# Patient Record
Sex: Male | Born: 1990 | Race: Black or African American | Hispanic: No | Marital: Single | Smoking: Never smoker
Health system: Southern US, Community
[De-identification: ages and names within clinical notes are randomized; demographics above are authoritative.]

---
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
## 2010-05-12 ENCOUNTER — Emergency Department (HOSPITAL_COMMUNITY): Admission: EM | Admit: 2010-05-12 | Discharge: 2010-05-12 | Payer: Self-pay | Admitting: Family Medicine

## 2010-11-15 ENCOUNTER — Emergency Department (HOSPITAL_COMMUNITY)
Admission: EM | Admit: 2010-11-15 | Discharge: 2010-11-15 | Payer: Self-pay | Source: Home / Self Care | Admitting: Family Medicine

## 2011-02-06 LAB — CBC
HCT: 42.3 % (ref 39.0–52.0)
Hemoglobin: 14.3 g/dL (ref 13.0–17.0)
RDW: 13.3 % (ref 11.5–15.5)
WBC: 15.5 10*3/uL — ABNORMAL HIGH (ref 4.0–10.5)

## 2011-02-06 LAB — POCT I-STAT, CHEM 8
BUN: 9 mg/dL (ref 6–23)
Calcium, Ion: 1.12 mmol/L (ref 1.12–1.32)
Creatinine, Ser: 1.1 mg/dL (ref 0.4–1.5)
TCO2: 26 mmol/L (ref 0–100)

## 2011-02-06 LAB — DIFFERENTIAL
Eosinophils Relative: 2 % (ref 0–5)
Lymphocytes Relative: 18 % (ref 12–46)
Lymphs Abs: 2.8 10*3/uL (ref 0.7–4.0)
Monocytes Absolute: 1 10*3/uL (ref 0.1–1.0)

## 2011-02-06 LAB — CULTURE, ROUTINE-ABSCESS

## 2011-04-01 ENCOUNTER — Inpatient Hospital Stay (INDEPENDENT_AMBULATORY_CARE_PROVIDER_SITE_OTHER)
Admission: RE | Admit: 2011-04-01 | Discharge: 2011-04-01 | Disposition: A | Discharge status: Home / Self Care | Payer: Self-pay | Source: Ambulatory Visit | Attending: Family Medicine | Admitting: Family Medicine

## 2011-04-01 DIAGNOSIS — K047 Periapical abscess without sinus: Secondary | ICD-10-CM

## 2013-02-14 ENCOUNTER — Encounter (HOSPITAL_COMMUNITY): Payer: Self-pay | Admitting: Emergency Medicine

## 2013-02-14 ENCOUNTER — Emergency Department (INDEPENDENT_AMBULATORY_CARE_PROVIDER_SITE_OTHER)
Admission: EM | Admit: 2013-02-14 | Discharge: 2013-02-14 | Disposition: A | Discharge status: Home / Self Care | Payer: Self-pay | Source: Home / Self Care | Attending: Family Medicine | Admitting: Family Medicine

## 2013-02-14 DIAGNOSIS — K121 Other forms of stomatitis: Secondary | ICD-10-CM

## 2013-02-14 MED ORDER — VALACYCLOVIR HCL 1 G PO TABS: 1000.0000 mg | ORAL_TABLET | Freq: Two times a day (BID) | ORAL | Status: AC

## 2013-02-14 MED ORDER — CIMETIDINE HCL 300 MG/5ML PO SOLN: 300.0000 mg | Freq: Two times a day (BID) | ORAL | Status: DC

## 2013-02-14 MED ORDER — IBUPROFEN 600 MG PO TABS: 600.0000 mg | ORAL_TABLET | Freq: Three times a day (TID) | ORAL | Status: DC | PRN

## 2013-02-14 MED ORDER — BENZOCAINE 20 % MT PSTE: 1.0000 "application " | PASTE | Freq: Four times a day (QID) | OROMUCOSAL | Status: DC | PRN

## 2013-02-14 NOTE — ED Notes (Signed)
Pt is here for sore on mouth onset Sunday Sx include: pain Denies: f/v/n/d Seen here about a yr ago at the [facility] for the same reason Does not remember dx Taking ibup w/no relief.   He is alert and oriented w/no signs of acute distress.

## 2013-02-14 NOTE — ED Provider Notes (Signed)
History     CSN: [id number]  Arrival date & time 02/14/13  4098   First MD Initiated Contact with Patient 02/14/13 1848      Chief Complaint  Patient presents with  . Mouth Lesions    (Consider location/radiation/quality/duration/timing/severity/associated sxs/prior treatment) HPI Comments: 22 year old smoker male here complaining of sores in his lips and his gums for 3 days. He has had some nasal congestion, general malaise associated with chills and subjective fever. He's taking over-the-counter 200 mg Advil with minimal improvement. Denies sore throat, headache, abdominal pain nausea vomiting or diarrhea. No rash in other body areas. No difficulty breathing or swallowing. No recent new medications or known triggers. Patient states she had similar symptoms about one year ago.    History reviewed. No pertinent past medical history.  History reviewed. No pertinent past surgical history.  No family history on file.  History  Substance Use Topics  . Smoking status: Current Every Day Smoker  . Smokeless tobacco: Not on file  . Alcohol Use: Yes      Review of Systems  Constitutional: Positive for fever and chills. Negative for diaphoresis and appetite change.  HENT: Positive for congestion and mouth sores. Negative for sore throat, trouble swallowing and voice change.   Eyes: Negative for pain and redness.  Respiratory: Negative for cough and shortness of breath.   Gastrointestinal: Negative for nausea, vomiting, abdominal pain and diarrhea.  Genitourinary: Negative for dysuria.  Skin: Negative for rash.  Neurological: Negative for dizziness and headaches.    Allergies  Review of patient's allergies indicates no known allergies.  Home Medications   Current Outpatient Rx  Name  Route  Sig  Dispense  Refill  . benzocaine (ORABASE-B) 20 % PSTE   Mouth/Throat   Use as directed 1 application in the mouth or throat 4 (four) times daily as needed. Apply every 6 hours as  needed in tender mouth area. Dispense 12g tube   1 each   0   . cimetidine (TAGAMET) 300 MG/5ML solution   Oral   Take 5 mLs (300 mg total) by mouth 2 (two) times daily.   237 mL   0   . ibuprofen (ADVIL,MOTRIN) 600 MG tablet   Oral   Take 1 tablet (600 mg total) by mouth every 8 (eight) hours as needed for pain or fever.   20 tablet   0   . valACYclovir (VALTREX) 1000 MG tablet   Oral   Take 1 tablet (1,000 mg total) by mouth 2 (two) times daily.   6 tablet   0     BP 152/78  Pulse 87  Temp(Src) 99.5 F (37.5 C) (Oral)  Resp 16  SpO2 99%  Physical Exam  Nursing note and vitals reviewed. Constitutional: He is oriented to person, place, and time. He appears well-developed and well-nourished. No distress.  HENT:  Head: Normocephalic and atraumatic.  Right Ear: External ear normal.  Left Ear: External ear normal.  Nose: Nose normal.  Mouth/Throat: Oropharynx is clear and moist. No oropharyngeal exudate.  aphthous lesions in inner lower lip and gingiva, associated mild to moderate erythema and swelling. No tongue, tonsils or pharyngeal involvement.  Eyes: Conjunctivae and EOM are normal. Pupils are equal, round, and reactive to light. Right eye exhibits no discharge. Left eye exhibits no discharge. No scleral icterus.  Neck: Neck supple. No thyromegaly present.  Cardiovascular: Normal heart sounds.   Pulmonary/Chest: Breath sounds normal.  Abdominal: Soft. There is no tenderness.  No hepatosplenomegaly  Lymphadenopathy:    He has no cervical adenopathy.  Neurological: He is alert and oriented to person, place, and time.  Skin: No rash noted. He is not diaphoretic.    ED Course  Procedures (including critical care time)  Labs Reviewed - No data to display No results found.   1. Stomatitis       MDM   Impress viral stomatitis. Treated with  Orabase, cimetidine, valacyclovir and ibuprofen.  Supportive care and red flags that should prompt his return to  medical attention discussed with patient and provided in writing.       Sharin Grave, MD 02/15/13 [id number]

## 2014-09-01 ENCOUNTER — Emergency Department (HOSPITAL_COMMUNITY)
Admission: EM | Admit: 2014-09-01 | Discharge: 2014-09-01 | Disposition: A | Discharge status: Home / Self Care | Payer: Self-pay | Attending: Emergency Medicine | Admitting: Emergency Medicine

## 2014-09-01 ENCOUNTER — Encounter (HOSPITAL_COMMUNITY): Payer: Self-pay | Admitting: Emergency Medicine

## 2014-09-01 ENCOUNTER — Emergency Department (HOSPITAL_COMMUNITY): Payer: Self-pay

## 2014-09-01 DIAGNOSIS — K120 Recurrent oral aphthae: Secondary | ICD-10-CM | POA: Insufficient documentation

## 2014-09-01 DIAGNOSIS — Z792 Long term (current) use of antibiotics: Secondary | ICD-10-CM | POA: Insufficient documentation

## 2014-09-01 DIAGNOSIS — J181 Lobar pneumonia, unspecified organism: Secondary | ICD-10-CM | POA: Insufficient documentation

## 2014-09-01 DIAGNOSIS — J189 Pneumonia, unspecified organism: Secondary | ICD-10-CM

## 2014-09-01 DIAGNOSIS — R0602 Shortness of breath: Secondary | ICD-10-CM

## 2014-09-01 DIAGNOSIS — Z720 Tobacco use: Secondary | ICD-10-CM | POA: Insufficient documentation

## 2014-09-01 DIAGNOSIS — Z79899 Other long term (current) drug therapy: Secondary | ICD-10-CM | POA: Insufficient documentation

## 2014-09-01 LAB — CBC WITH DIFFERENTIAL/PLATELET
BAND NEUTROPHILS: 0 % (ref 0–10)
BASOS ABS: 0 10*3/uL (ref 0.0–0.1)
BASOS PCT: 0 % (ref 0–1)
BLASTS: 0 %
Eosinophils Absolute: 0 10*3/uL (ref 0.0–0.7)
Eosinophils Relative: 0 % (ref 0–5)
HEMATOCRIT: 48.2 % (ref 39.0–52.0)
HEMOGLOBIN: 16.2 g/dL (ref 13.0–17.0)
LYMPHS ABS: 1.7 10*3/uL (ref 0.7–4.0)
LYMPHS PCT: 12 % (ref 12–46)
MCH: 29 pg (ref 26.0–34.0)
MCHC: 33.6 g/dL (ref 30.0–36.0)
MCV: 86.2 fL (ref 78.0–100.0)
METAMYELOCYTES PCT: 0 %
Monocytes Absolute: 1 10*3/uL (ref 0.1–1.0)
Monocytes Relative: 7 % (ref 3–12)
Myelocytes: 0 %
Neutro Abs: 11.6 10*3/uL — ABNORMAL HIGH (ref 1.7–7.7)
Neutrophils Relative %: 81 % — ABNORMAL HIGH (ref 43–77)
PROMYELOCYTES ABS: 0 %
Platelets: 277 10*3/uL (ref 150–400)
RBC: 5.59 MIL/uL (ref 4.22–5.81)
RDW: 13.2 % (ref 11.5–15.5)
WBC: 14.3 10*3/uL — ABNORMAL HIGH (ref 4.0–10.5)
nRBC: 0 /100 WBC

## 2014-09-01 LAB — COMPREHENSIVE METABOLIC PANEL
ALBUMIN: 4.5 g/dL (ref 3.5–5.2)
ALT: 10 U/L (ref 0–53)
AST: 14 U/L (ref 0–37)
Alkaline Phosphatase: 67 U/L (ref 39–117)
Anion gap: 16 — ABNORMAL HIGH (ref 5–15)
BUN: 11 mg/dL (ref 6–23)
CALCIUM: 10 mg/dL (ref 8.4–10.5)
CHLORIDE: 100 meq/L (ref 96–112)
CO2: 25 mEq/L (ref 19–32)
CREATININE: 1 mg/dL (ref 0.50–1.35)
GFR calc Af Amer: >90 mL/min (ref >90)
GFR calc non Af Amer: >90 mL/min (ref >90)
Glucose, Bld: 124 mg/dL — ABNORMAL HIGH (ref 70–99)
Potassium: 3.9 mEq/L (ref 3.7–5.3)
SODIUM: 141 meq/L (ref 137–147)
Total Bilirubin: 0.6 mg/dL (ref 0.3–1.2)
Total Protein: 9.3 g/dL — ABNORMAL HIGH (ref 6.0–8.3)

## 2014-09-01 LAB — RAPID STREP SCREEN (MED CTR MEBANE ONLY): STREPTOCOCCUS, GROUP A SCREEN (DIRECT): NEGATIVE

## 2014-09-01 MED ORDER — AZITHROMYCIN 250 MG PO TABS: 250.0000 mg | ORAL_TABLET | Freq: Every day | ORAL | Status: DC

## 2014-09-01 MED ORDER — ACYCLOVIR 400 MG PO TABS: 400.0000 mg | ORAL_TABLET | Freq: Three times a day (TID) | ORAL | Status: DC

## 2014-09-01 MED ORDER — MAGIC MOUTHWASH: 5.0000 mL | Freq: Three times a day (TID) | ORAL | Status: DC

## 2014-09-01 MED ORDER — SODIUM CHLORIDE 0.9 % IV SOLN
Freq: Once | INTRAVENOUS | Status: AC
  Administered 2014-09-01: 11:00:00 via INTRAVENOUS

## 2014-09-01 NOTE — ED Notes (Signed)
Pt. Stated Frank [name] had a fever with mouth swelling and sores in m mouth and throat.  Onset Thursday.

## 2014-09-01 NOTE — ED Provider Notes (Signed)
CSN: [id number]     Arrival date & time 09/01/14  0846 History   First MD Initiated Contact with Patient 09/01/14 [phone]     Chief Complaint  Patient presents with  . Fever  . Oral Swelling  . Sore Throat     (Consider location/radiation/quality/duration/timing/severity/associated sxs/prior Treatment) HPI  Frank [name] is a 23 y.o. male with PMH of aphthous stomatitis presenting with four day history of tactile fevers, chills with productive cough of thick green mucus. Patient also with the sore throat and the sensation of mouth swelling. Patient with runny nose and sinus congestion. Patient also with oral lesions on inner bottom lip. Patient with history of these on prior occasions during illnesses or stress. Last time one year ago. Patient also with upcoming dental procedure. And has been on amoxicillin 3 times daily. Patient denies headache, chest pain, abdominal pain, back pain.   History reviewed. No pertinent past medical history. History reviewed. No pertinent past surgical history. No family history on file. History  Substance Use Topics  . Smoking status: Current Every Day Smoker  . Smokeless tobacco: Not on file  . Alcohol Use: Yes    Review of Systems  Constitutional: Positive for fever and chills.  HENT: Positive for congestion, sinus pressure and sore throat. Negative for rhinorrhea.   Eyes: Negative for visual disturbance.  Respiratory: Positive for cough. Negative for shortness of breath.   Cardiovascular: Negative for chest pain and palpitations.  Gastrointestinal: Negative for nausea, vomiting and diarrhea.  Genitourinary: Negative for dysuria and hematuria.  Musculoskeletal: Negative for back pain and gait problem.  Skin: Negative for rash.  Neurological: Negative for weakness and headaches.      Allergies  Review of patient's allergies indicates no known allergies.  Home Medications   Prior to Admission medications   Medication Sig Start Date End Date  Taking? Authorizing Provider  acetaminophen (TYLENOL) 160 MG/5ML liquid Take 320 mg by mouth every 6 (six) hours as needed for fever.    Yes Historical Provider, MD  AMOXICILLIN PO Take by mouth 3 (three) times daily.   Yes Historical Provider, MD  acyclovir (ZOVIRAX) 400 MG tablet Take 1 tablet (400 mg total) by mouth 3 (three) times daily. 09/01/14   Louann SjogrenVictoria L Taahir Grisby, PA-C  Alum & Mag Hydroxide-Simeth (MAGIC MOUTHWASH) SOLN Take 5 mLs by mouth 3 (three) times daily. 09/01/14   Louann SjogrenVictoria L Day Deery, PA-C  azithromycin (ZITHROMAX) 250 MG tablet Take 1 tablet (250 mg total) by mouth daily. Take first 2 tablets together, then 1 every day until finished. 09/01/14   Louann SjogrenVictoria L Sinda Leedom, PA-C  benzocaine (ORABASE-B) 20 % PSTE Use as directed 1 application in the mouth or throat 4 (four) times daily as needed. Apply every 6 hours as needed in tender mouth area. Dispense 12g tube 02/14/13   Adlih Moreno-Coll, MD  cimetidine (TAGAMET) 300 MG/5ML solution Take 5 mLs (300 mg total) by mouth 2 (two) times daily. 02/14/13   Adlih Moreno-Coll, MD  ibuprofen (ADVIL,MOTRIN) 600 MG tablet Take 1 tablet (600 mg total) by mouth every 8 (eight) hours as needed for pain or fever. 02/14/13   Adlih Moreno-Coll, MD   BP 114/68 mmHg  Pulse 82  Temp(Src) 99.1 F (37.3 C) (Oral)  Resp 22  Ht 5\' 10"  (1.778 m)  Wt 150 lb (68.04 kg)  BMI 21.52 kg/m2  SpO2 96% Physical Exam  Constitutional: He appears well-developed and well-nourished. No distress.  HENT:  Head: Normocephalic and atraumatic.  Nose: Right sinus  exhibits no maxillary sinus tenderness and no frontal sinus tenderness. Left sinus exhibits no maxillary sinus tenderness and no frontal sinus tenderness.  Mouth/Throat: Mucous membranes are normal. Posterior oropharyngeal edema and posterior oropharyngeal erythema present. No oropharyngeal exudate.  Multiple aphthous lesions on lower inner lip and bilateral mucous membranes. No trismus, lip, neck, face swelling. No  tenderness under the tongue.  Eyes: Conjunctivae and EOM are normal. Right eye exhibits no discharge. Left eye exhibits no discharge.  Neck: Normal range of motion. Neck supple.  Cardiovascular: Normal rate, regular rhythm and normal heart sounds.   Pulmonary/Chest: Effort normal. No stridor. No respiratory distress. He has no wheezes. He has rales.  Wheezing and rales at base of right lung. With non on left. No respiratory distress.  Abdominal: Soft. Bowel sounds are normal. He exhibits no distension. There is no tenderness.  Lymphadenopathy:    He has cervical adenopathy.  Neurological: He is alert.  Skin: Skin is warm and dry. He is not diaphoretic.  Nursing note and vitals reviewed.   ED Course  Procedures (including critical care time) Labs Review Labs Reviewed  CBC WITH DIFFERENTIAL - Abnormal; Notable for the following:    WBC 14.3 (*)    Neutrophils Relative % 81 (*)    Neutro Abs 11.6 (*)    All other components within normal limits  COMPREHENSIVE METABOLIC PANEL - Abnormal; Notable for the following:    Glucose, Bld 124 (*)    Total Protein 9.3 (*)    Anion gap 16 (*)    All other components within normal limits  RAPID STREP SCREEN  CULTURE, GROUP A STREP    Imaging Review Dg Chest 2 View  09/01/2014   CLINICAL DATA:  Productive cough.  Fever  EXAM: CHEST  2 VIEW  COMPARISON:  None.  FINDINGS: The heart size and mediastinal contours are within normal limits. Both lungs are clear. The visualized skeletal structures are unremarkable.  IMPRESSION: No active cardiopulmonary disease.   Electronically Signed   By: Myles Rosenthal M.D.   On: 09/01/2014 10:34     EKG Interpretation None      Meds given in ED:  Medications  0.9 %  sodium chloride infusion ( Intravenous Stopped 09/01/14 1115)    Discharge Medication List as of 09/01/2014 11:09 AM    START taking these medications   Details  acyclovir (ZOVIRAX) 400 MG tablet Take 1 tablet (400 mg total) by mouth 3 (three)  times daily., Starting 09/01/2014, Until Discontinued, Print    Alum & Mag Hydroxide-Simeth (MAGIC MOUTHWASH) SOLN Take 5 mLs by mouth 3 (three) times daily., Starting 09/01/2014, Until Discontinued, Print    azithromycin (ZITHROMAX) 250 MG tablet Take 1 tablet (250 mg total) by mouth daily. Take first 2 tablets together, then 1 every day until finished., Starting 09/01/2014, Until Discontinued, Print          MDM   Final diagnoses:  SOB (shortness of breath)  Right lower lobe pneumonia   Patient has been diagnosed with CAP due to fevers, productive cough and rales on exam on right lower lobe. Pt with negative CXR but presentation and exam consistent with PNA. Pt is not ill appearing, immunocompromised, and does not have multiple co morbidities, therefore I feel like the they can be treated as an OP with abx therapy. Presentation non concerning for PTA or infxn spread to soft tissue. No trismus or uvula deviation. No lip, face or neck swelling. Mild edema and erythema of oropharynx. Specific return  precautions discussed. Pt has been advised to return to the ED if symptoms worsen or they do not improve. Pt without PCP and is to follow up with the wellness clinic in 2-3 days. Pt verbalizes understanding and is agreeable with plan.   Discussed return precautions with patient. Discussed all results and patient verbalizes understanding and agrees with plan.  This is a shared patient. This patient was discussed with the physician who saw and evaluated the patient and agrees with the plan.       Louann SjogrenVictoria L Wael Maestas, PA-C 09/01/14 1504  Louann SjogrenVictoria L Tarez Bowns, [place] JerseyPA-C 09/01/14 1504

## 2014-09-01 NOTE — Discharge Instructions (Signed)
Return to the emergency room with worsening of symptoms, new symptoms or with symptoms that are concerning, especially fevers, stiff neck, worsening headache, nausea/vomiting, visual changes or slurred speech, chest pain, shortness of breath, cough with thick colored mucous or blood. Drink plenty of fluids with electrolytes especially Gatorade. OTC cold medications such as mucinex, nyquil, dayquil are recommended. Chloraseptic for sore throat. Please take all of your antibiotics until finished!   You may develop abdominal discomfort or diarrhea from the antibiotic.  You may help offset this with probiotics which you can buy or get in yogurt. Do not eat  or take the probiotics until 2 hours after your antibiotic.  Call to make appointment with the wellness center to follow up in 2-3 days. Number above.

## 2014-09-03 LAB — CULTURE, GROUP A STREP

## 2019-04-24 ENCOUNTER — Other Ambulatory Visit: Payer: Self-pay

## 2019-04-24 ENCOUNTER — Encounter (HOSPITAL_COMMUNITY): Payer: Self-pay

## 2019-04-24 ENCOUNTER — Ambulatory Visit (HOSPITAL_COMMUNITY)
Admission: EM | Admit: 2019-04-24 | Discharge: 2019-04-24 | Disposition: A | Discharge status: Home / Self Care | Payer: Self-pay | Attending: Family Medicine | Admitting: Family Medicine

## 2019-04-24 DIAGNOSIS — K121 Other forms of stomatitis: Secondary | ICD-10-CM

## 2019-04-24 DIAGNOSIS — K0889 Other specified disorders of teeth and supporting structures: Secondary | ICD-10-CM

## 2019-04-24 MED ORDER — AMOXICILLIN-POT CLAVULANATE 875-125 MG PO TABS: 1.0000 | ORAL_TABLET | Freq: Two times a day (BID) | ORAL | 0 refills | Status: AC

## 2019-04-24 MED ORDER — MAGIC MOUTHWASH W/LIDOCAINE: 5.0000 mL | Freq: Four times a day (QID) | ORAL | 0 refills | Status: DC | PRN

## 2019-04-24 NOTE — Discharge Instructions (Signed)
There are various ulcers in your mouth which are likely causing the pain Typically oral ulcers are viral and resolve on their own in 1 to 2 weeks Please use Magic mouthwash rinse for 30 seconds and spit Please also begin Augmentin twice daily for the next week to cover for infection in the mouth-do not take this more frequently than prescribed, take the full course  Please follow-up in 1 week if not having any improvement with the above Please follow-up sooner if developing fevers, worsening pain, swelling or difficulty swallowing

## 2019-04-24 NOTE — ED Triage Notes (Signed)
Pt states he has a toothache and facial swelling. X 1 week.

## 2019-04-24 NOTE — ED Provider Notes (Signed)
[facility]    CSN: [id number] Arrival date & time: 04/24/19  1006      History   Chief Complaint Chief Complaint  Patient presents with  . Dental Pain    HPI Frank [name] is a 28 y.o. male no significant past medical history presenting today for evaluation of oral pain.  Patient states that since last Wednesday for the past 5 to 6 days he has had discomfort in his lips tongue and mouth.  He feels his gums are inflamed, but denies specific area as source of pain.  Notes that he has 2 broken teeth.  He has tried 1 tablet of antibiotic that he had at home without relief.  He has not tried any other medicines.  Denies any fevers.  Has some mild discomfort with swallowing, but denies throat swelling or shortness of breath.  Denies associated cough congestion or sore throat.  Denies tobacco use; does admit to marijuana use and smokes marijuana using cigar wrap.  HPI  History reviewed. No pertinent past medical history.  There are no active problems to display for this patient.   History reviewed. No pertinent surgical history.     Home Medications    Prior to Admission medications   Medication Sig Start Date End Date Taking? Authorizing Provider  acetaminophen (TYLENOL) 160 MG/5ML liquid Take 320 mg by mouth every 6 (six) hours as needed for fever.     [provider]  acyclovir (ZOVIRAX) 400 MG tablet Take 1 tablet (400 mg total) by mouth 3 (three) times daily. 09/01/14   Oswaldo Conroyreech, Victoria, PA-C  Alum & Mag Hydroxide-Simeth (MAGIC MOUTHWASH) SOLN Take 5 mLs by mouth 3 (three) times daily. 09/01/14   Oswaldo Conroyreech, Victoria, PA-C  amoxicillin-clavulanate (AUGMENTIN) 875-125 MG tablet Take 1 tablet by mouth every 12 (twelve) hours for 7 days. 04/24/19 05/01/19  Wieters, Hallie C, PA-C  azithromycin (ZITHROMAX) 250 MG tablet Take 1 tablet (250 mg total) by mouth daily. Take first 2 tablets together, then 1 every day until finished. 09/01/14   Oswaldo Conroyreech, Victoria, PA-C   benzocaine (ORABASE-B) 20 % PSTE Use as directed 1 application in the mouth or throat 4 (four) times daily as needed. Apply every 6 hours as needed in tender mouth area. Dispense 12g tube Patient not taking: Reported on 09/03/2014 02/14/13   Moreno-Coll, Adlih, MD  cimetidine (TAGAMET) 300 MG/5ML solution Take 5 mLs (300 mg total) by mouth 2 (two) times daily. Patient not taking: Reported on 09/03/2014 02/14/13   Moreno-Coll, Adlih, MD  ibuprofen (ADVIL,MOTRIN) 600 MG tablet Take 1 tablet (600 mg total) by mouth every 8 (eight) hours as needed for pain or fever. Patient not taking: Reported on 09/02/2014 02/14/13   Moreno-Coll, Adlih, MD  magic mouthwash w/lidocaine SOLN Take 5 mLs by mouth 4 (four) times daily as needed for mouth pain. 04/24/19   Wieters, Junius CreamerHallie C, PA-C    Family History History reviewed. No pertinent family history.  Social History Social History   Tobacco Use  . Smoking status: Current Every Day Smoker  . Smokeless tobacco: Never Used  Substance Use Topics  . Alcohol use: Yes  . Drug use: Yes    Types: Marijuana     Allergies   Patient has no known allergies.   Review of Systems Review of Systems  Constitutional: Negative for activity change, appetite change, chills, fatigue and fever.  HENT: Positive for dental problem and mouth sores. Negative for congestion, ear pain, rhinorrhea, sinus pressure, sore throat and trouble  swallowing.   Eyes: Negative for discharge and redness.  Respiratory: Negative for cough, chest tightness and shortness of breath.   Cardiovascular: Negative for chest pain.  Gastrointestinal: Negative for abdominal pain, diarrhea, nausea and vomiting.  Musculoskeletal: Negative for myalgias.  Skin: Negative for rash.  Neurological: Negative for dizziness, light-headedness and headaches.     Physical Exam Triage Vital Signs ED Triage Vitals [04/24/19 1030]  Enc Vitals Group     BP 135/81     Pulse Rate 65     Resp 18     Temp 98.5 F  (36.9 C)     Temp Source Oral     SpO2 100 %     Weight 165 lb (74.8 kg)     Height      Head Circumference      Peak Flow      Pain Score 9     Pain Loc      Pain Edu?      Excl. in [place]?    No data found.  Updated Vital Signs BP 135/81 (BP Location: Right Arm)   Pulse 65   Temp 98.5 F (36.9 C) (Oral)   Resp 18   Wt 165 lb (74.8 kg)   SpO2 100%   BMI 23.68 kg/m   Visual Acuity Right Eye Distance:   Left Eye Distance:   Bilateral Distance:    Right Eye Near:   Left Eye Near:    Bilateral Near:     Physical Exam Vitals signs and nursing note reviewed.  Constitutional:      Appearance: He is well-developed.     Comments: No acute distress  HENT:     Head: Normocephalic and atraumatic.     Ears:     Comments: Bilateral ears without tenderness to palpation of external auricle, tragus and mastoid, EAC's without erythema or swelling, TM's with good bony landmarks and cone of light. Non erythematous.     Nose: Nose normal.     Mouth/Throat:     Comments: Oral mucosa with various areas of whitish-yellowish plaque appearing discoloration with surrounding erythema, notable to bilateral buccal mucosa, soft palate on left side, left lateral aspect of tongue; not noted on posterior pharynx  To lower teeth bilaterally that are fractured with root still remaining with mild surrounding gingival tenderness Eyes:     Conjunctiva/sclera: Conjunctivae normal.  Neck:     Musculoskeletal: Neck supple.  Cardiovascular:     Rate and Rhythm: Normal rate.  Pulmonary:     Effort: Pulmonary effort is normal. No respiratory distress.  Abdominal:     General: There is no distension.  Musculoskeletal: Normal range of motion.  Skin:    General: Skin is warm and dry.  Neurological:     Mental Status: He is alert and oriented to person, place, and time.        UC Treatments / Results  Labs (all labs ordered are listed, but only abnormal results are displayed) Labs Reviewed - No  data to display  EKG None  Radiology No results found.  Procedures Procedures (including critical care time)  Medications Ordered in UC Medications - No data to display  Initial Impression / Assessment and Plan / UC Course  I have reviewed the triage vital signs and the nursing notes.  Pertinent labs & imaging results that were available during my care of the patient were reviewed by me and considered in my medical decision making (see chart for details).  Patient appears to have mouth ulcers that are not typical at this ulcer appearance x 1 week.  Discussed viral etiology, providing Magic mouthwash to use for discomfort.  We will go ahead and place on Augmentin to cover for dental infection as well.  Denies tobacco use, feel oral cancer less likely given pain and acute onset over the past week.  Exam not suggestive of thrush at this time.  Advised to follow-up in 1 week if not seeing improvement.  Follow-up sooner if symptoms progressing or worsening, developing fevers or involving throat. Final Clinical Impressions(s) / UC Diagnoses   Final diagnoses:  Mouth ulcers  Pain, dental     Discharge Instructions     There are various ulcers in your mouth which are likely causing the pain Typically oral ulcers are viral and resolve on their own in 1 to 2 weeks Please use Magic mouthwash rinse for 30 seconds and spit Please also begin Augmentin twice daily for the next week to cover for infection in the mouth-do not take this more frequently than prescribed, take the full course  Please follow-up in 1 week if not having any improvement with the above Please follow-up sooner if developing fevers, worsening pain, swelling or difficulty swallowing   ED Prescriptions    Medication Sig Dispense Auth. Provider   amoxicillin-clavulanate (AUGMENTIN) 875-125 MG tablet Take 1 tablet by mouth every 12 (twelve) hours for 7 days. 14 tablet Wieters, Hallie C, PA-C   magic mouthwash  w/lidocaine SOLN Take 5 mLs by mouth 4 (four) times daily as needed for mouth pain. 150 mL Wieters, Hallie C, PA-C     Controlled Substance Prescriptions [place] Controlled Substance Registry consulted? Not Applicable   Lew DawesWieters, Hallie C, [place]PA-C 04/24/19 1100

## 2020-02-09 ENCOUNTER — Encounter (HOSPITAL_COMMUNITY): Payer: Self-pay | Admitting: Emergency Medicine

## 2020-02-09 ENCOUNTER — Other Ambulatory Visit: Payer: Self-pay

## 2020-02-09 ENCOUNTER — Inpatient Hospital Stay (HOSPITAL_COMMUNITY)
Admission: EM | Admit: 2020-02-09 | Discharge: 2020-02-10 | DRG: 976 | Disposition: A | Discharge status: Home / Self Care | Payer: Self-pay | Attending: Internal Medicine | Admitting: Internal Medicine

## 2020-02-09 DIAGNOSIS — R1310 Dysphagia, unspecified: Secondary | ICD-10-CM | POA: Diagnosis present

## 2020-02-09 DIAGNOSIS — K120 Recurrent oral aphthae: Secondary | ICD-10-CM

## 2020-02-09 DIAGNOSIS — B370 Candidal stomatitis: Secondary | ICD-10-CM | POA: Diagnosis present

## 2020-02-09 DIAGNOSIS — B3781 Candidal esophagitis: Secondary | ICD-10-CM | POA: Diagnosis present

## 2020-02-09 DIAGNOSIS — D72829 Elevated white blood cell count, unspecified: Secondary | ICD-10-CM | POA: Diagnosis present

## 2020-02-09 DIAGNOSIS — R739 Hyperglycemia, unspecified: Secondary | ICD-10-CM | POA: Diagnosis present

## 2020-02-09 DIAGNOSIS — Z20822 Contact with and (suspected) exposure to covid-19: Secondary | ICD-10-CM | POA: Diagnosis present

## 2020-02-09 DIAGNOSIS — B002 Herpesviral gingivostomatitis and pharyngotonsillitis: Secondary | ICD-10-CM | POA: Diagnosis present

## 2020-02-09 DIAGNOSIS — B20 Human immunodeficiency virus [HIV] disease: Principal | ICD-10-CM | POA: Diagnosis present

## 2020-02-09 DIAGNOSIS — F17200 Nicotine dependence, unspecified, uncomplicated: Secondary | ICD-10-CM | POA: Diagnosis present

## 2020-02-09 DIAGNOSIS — E860 Dehydration: Secondary | ICD-10-CM | POA: Diagnosis present

## 2020-02-09 LAB — CREATININE, SERUM
Creatinine, Ser: 1.09 mg/dL (ref 0.61–1.24)
GFR calc Af Amer: >60 mL/min (ref >60)
GFR calc non Af Amer: >60 mL/min (ref >60)

## 2020-02-09 LAB — CBC WITH DIFFERENTIAL/PLATELET
Abs Immature Granulocytes: 0.03 10*3/uL (ref 0.00–0.07)
Basophils Absolute: 0 10*3/uL (ref 0.0–0.1)
Basophils Relative: 1 %
Eosinophils Absolute: 0 10*3/uL (ref 0.0–0.5)
Eosinophils Relative: 0 %
HCT: 48.6 % (ref 39.0–52.0)
Hemoglobin: 15.7 g/dL (ref 13.0–17.0)
Immature Granulocytes: 0 %
Lymphocytes Relative: 21 %
Lymphs Abs: 1.9 10*3/uL (ref 0.7–4.0)
MCH: 28.7 pg (ref 26.0–34.0)
MCHC: 32.3 g/dL (ref 30.0–36.0)
MCV: 88.8 fL (ref 80.0–100.0)
Monocytes Absolute: 1.3 10*3/uL — ABNORMAL HIGH (ref 0.1–1.0)
Monocytes Relative: 15 %
Neutro Abs: 5.6 10*3/uL (ref 1.7–7.7)
Neutrophils Relative %: 63 %
Platelets: 367 10*3/uL (ref 150–400)
RBC: 5.47 MIL/uL (ref 4.22–5.81)
RDW: 12.7 % (ref 11.5–15.5)
WBC: 8.8 10*3/uL (ref 4.0–10.5)
nRBC: 0 % (ref 0.0–0.2)

## 2020-02-09 LAB — BASIC METABOLIC PANEL
Anion gap: 16 — ABNORMAL HIGH (ref 5–15)
BUN: 12 mg/dL (ref 6–20)
CO2: 23 mmol/L (ref 22–32)
Calcium: 9.6 mg/dL (ref 8.9–10.3)
Chloride: 101 mmol/L (ref 98–111)
Creatinine, Ser: 1.1 mg/dL (ref 0.61–1.24)
GFR calc Af Amer: >60 mL/min (ref >60)
GFR calc non Af Amer: >60 mL/min (ref >60)
Glucose, Bld: 125 mg/dL — ABNORMAL HIGH (ref 70–99)
Potassium: 3.5 mmol/L (ref 3.5–5.1)
Sodium: 140 mmol/L (ref 135–145)

## 2020-02-09 LAB — CBC
HCT: 44.5 % (ref 39.0–52.0)
Hemoglobin: 14.1 g/dL (ref 13.0–17.0)
MCH: 28.3 pg (ref 26.0–34.0)
MCHC: 31.7 g/dL (ref 30.0–36.0)
MCV: 89.2 fL (ref 80.0–100.0)
Platelets: 328 10*3/uL (ref 150–400)
RBC: 4.99 MIL/uL (ref 4.22–5.81)
RDW: 12.8 % (ref 11.5–15.5)
WBC: 7.9 10*3/uL (ref 4.0–10.5)
nRBC: 0 % (ref 0.0–0.2)

## 2020-02-09 LAB — RAPID HIV SCREEN (HIV 1/2 AB+AG)
HIV 1/2 Antibodies: NONREACTIVE
HIV-1 P24 Antigen - HIV24: REACTIVE — AB

## 2020-02-09 MED ORDER — MAGIC MOUTHWASH
10.0000 mL | Freq: Once | ORAL | Status: AC
  Administered 2020-02-09: 10 mL via ORAL
  Filled 2020-02-09: qty 10

## 2020-02-09 MED ORDER — LIDOCAINE VISCOUS HCL 2 % MT SOLN
15.0000 mL | Freq: Once | OROMUCOSAL | Status: AC
  Administered 2020-02-09: 15 mL via OROMUCOSAL
  Filled 2020-02-09: qty 15

## 2020-02-09 MED ORDER — MORPHINE SULFATE (PF) 4 MG/ML IV SOLN
4.0000 mg | Freq: Once | INTRAVENOUS | Status: AC
  Administered 2020-02-09: 21:00:00 4 mg via INTRAVENOUS
  Filled 2020-02-09: qty 1

## 2020-02-09 MED ORDER — ACETAMINOPHEN 650 MG RE SUPP: 650.0000 mg | Freq: Four times a day (QID) | RECTAL | Status: DC | PRN

## 2020-02-09 MED ORDER — ACETAMINOPHEN 325 MG PO TABS: 650.0000 mg | ORAL_TABLET | Freq: Four times a day (QID) | ORAL | Status: DC | PRN

## 2020-02-09 MED ORDER — MAGIC MOUTHWASH
5.0000 mL | Freq: Four times a day (QID) | ORAL | Status: DC | PRN
  Administered 2020-02-10 (×2): 5 mL via ORAL
  Filled 2020-02-09 (×3): qty 5

## 2020-02-09 MED ORDER — DEXTROSE-NACL 5-0.9 % IV SOLN: INTRAVENOUS | Status: DC

## 2020-02-09 MED ORDER — ONDANSETRON HCL 4 MG PO TABS: 4.0000 mg | ORAL_TABLET | Freq: Four times a day (QID) | ORAL | Status: DC | PRN

## 2020-02-09 MED ORDER — FLUCONAZOLE 100MG IVPB
100.0000 mg | Freq: Every day | INTRAVENOUS | Status: DC
  Filled 2020-02-09: qty 50

## 2020-02-09 MED ORDER — SODIUM CHLORIDE 0.9 % IV BOLUS
1000.0000 mL | Freq: Once | INTRAVENOUS | Status: AC
  Administered 2020-02-09: 21:00:00 1000 mL via INTRAVENOUS

## 2020-02-09 MED ORDER — FLUCONAZOLE 200 MG PO TABS
200.0000 mg | ORAL_TABLET | Freq: Once | ORAL | Status: AC
  Administered 2020-02-09: 200 mg via ORAL
  Filled 2020-02-09: qty 1

## 2020-02-09 MED ORDER — ONDANSETRON HCL 4 MG/2ML IJ SOLN: 4.0000 mg | Freq: Four times a day (QID) | INTRAMUSCULAR | Status: DC | PRN

## 2020-02-09 MED ORDER — FOLIC ACID 1 MG PO TABS
1.0000 mg | ORAL_TABLET | Freq: Every day | ORAL | Status: DC
  Administered 2020-02-10: 1 mg via ORAL
  Filled 2020-02-09: qty 1

## 2020-02-09 MED ORDER — ENOXAPARIN SODIUM 40 MG/0.4ML ~~LOC~~ SOLN
40.0000 mg | Freq: Every day | SUBCUTANEOUS | Status: DC
  Administered 2020-02-10: 40 mg via SUBCUTANEOUS
  Filled 2020-02-09: qty 0.4

## 2020-02-09 NOTE — ED Triage Notes (Signed)
C/o sore throat x 1 week.  States he has been spitting secretions out and unable to swallow.  States he has been taking amoxicillin and hydrocodone x 5 days that was recently given to his wife for dental work.  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

## 2020-02-09 NOTE — H&P (Signed)
History and Physical   Frank [name]:[id number] DOB: November 03, 1990 DOA: 02/09/2020  Referring MD/NP/PA: Dr. Silverio Lay  PCP: Patient, No Pcp Per   Outpatient Specialists: None  Patient coming from: Home  Chief Complaint: Sore throat  HPI: Frank [name] is a 29 y.o. male with medical history significant of no significant past medical history who is happily married for 10 years presenting to the ER with 1 week history of progressive dysphagia and sore throat.  Patient has been having constant burning sensation in his throat when eating or swallowing.  This is going down his chest.  Patient has no idea what is been going on.  He took some amoxicillin as well as hydrocodone in the last 5 days blunting to his wife.  Symptoms have continued to get worse.  He was not able to eat solid food so he came to the ER.  Initial evaluation showed extensive oral thrush with suspicion for esophagitis due to Candida.  Further evaluation showed patient is reactive and is HIV positive.  Patient denied any prior knowledge of these.  He has been married for 10 years and has been with only one partner.  She has 2 children aged 63 and 24 both healthy.  He is not sure where and how he got it.  Does not know partners status at this point.  He has been initiated on antifungals.  Dr. Ninetta Lights of infectious disease has been contacted and will consult on the patient in the morning.  His CD4 and viral load have been ordered and awaiting results at this point..  ED Course: Temperature 99.4 blood pressure 149/96 pulse 113 respiratory of 18 oxygen sat 95% on room air.  CBC and chemistry largely within normal except for glucose 125.  COVID-19 screen is pending.  HIV-1 P 24 antigen is reactive.  Chest x-ray shows no acute findings.  Patient being admitted with candidal esophagitis most likely a new diagnosis of HIV disease.  Review of Systems: As per HPI otherwise 10 point review of systems negative.    History reviewed. No pertinent past  medical history.  History reviewed. No pertinent surgical history.   reports that he has been smoking. He has never used smokeless tobacco. He reports current alcohol use. He reports current drug use. Drug: Marijuana.  No Known Allergies  No family history on file.   Prior to Admission medications   Medication Sig Start Date End Date Taking? Authorizing Provider  acetaminophen (TYLENOL) 160 MG/5ML liquid Take 320 mg by mouth every 6 (six) hours as needed for fever.    Yes [provider]  amoxicillin (AMOXIL) 500 MG capsule Take 500 mg by mouth 3 (three) times daily.   Yes [provider]  HYDROcodone-acetaminophen (NORCO/VICODIN) 5-325 MG tablet Take 1 tablet by mouth every 6 (six) hours as needed for moderate pain.   Yes [provider]  Tetrahydrozoline HCl (VISINE OP) Place 1 drop into both eyes daily as needed (For dry eyes).    Yes [provider]  acyclovir (ZOVIRAX) 400 MG tablet Take 1 tablet (400 mg total) by mouth 3 (three) times daily. Patient not taking: Reported on 02/09/2020 09/01/14   Oswaldo Conroy, PA-C  Alum & Mag Hydroxide-Simeth (MAGIC MOUTHWASH) SOLN Take 5 mLs by mouth 3 (three) times daily. Patient not taking: Reported on 02/09/2020 09/01/14   Oswaldo Conroy, PA-C  azithromycin (ZITHROMAX) 250 MG tablet Take 1 tablet (250 mg total) by mouth daily. Take first 2 tablets together, then 1 every day  until finished. Patient not taking: Reported on 02/09/2020 09/01/14   Al Corpus, PA-C  benzocaine (ORABASE-B) 20 % PSTE Use as directed 1 application in the mouth or throat 4 (four) times daily as needed. Apply every 6 hours as needed in tender mouth area. Dispense 12g tube Patient not taking: Reported on 09/03/2014 02/14/13   Moreno-Coll, Adlih, MD  cimetidine (TAGAMET) 300 MG/5ML solution Take 5 mLs (300 mg total) by mouth 2 (two) times daily. Patient not taking: Reported on 09/03/2014 02/14/13   Moreno-Coll, Adlih, MD  ibuprofen  (ADVIL,MOTRIN) 600 MG tablet Take 1 tablet (600 mg total) by mouth every 8 (eight) hours as needed for pain or fever. Patient not taking: Reported on 09/02/2014 02/14/13   Moreno-Coll, Adlih, MD  magic mouthwash w/lidocaine SOLN Take 5 mLs by mouth 4 (four) times daily as needed for mouth pain. Patient not taking: Reported on 02/09/2020 04/24/19   Janith Lima, [place]    Physical Exam: Vitals:   02/09/20 1750 02/09/20 1815 02/09/20 2122 02/09/20 2315  BP: (!) 149/96  123/87 125/78  Pulse: (!) 113  92 67  Resp: 14  16 18   Temp: 99.4 F (37.4 C) 99.4 F (37.4 C)    TempSrc: Oral Oral    SpO2: 97%  97% 100%  Weight: 74.8 kg     Height: 5\' 9"  (1.753 m)         Constitutional: NAD, calm, comfortable, no significant cachexia Vitals:   02/09/20 1750 02/09/20 1815 02/09/20 2122 02/09/20 2315  BP: (!) 149/96  123/87 125/78  Pulse: (!) 113  92 67  Resp: 14  16 18   Temp: 99.4 F (37.4 C) 99.4 F (37.4 C)    TempSrc: Oral Oral    SpO2: 97%  97% 100%  Weight: 74.8 kg     Height: 5\' 9"  (1.753 m)      Eyes: PERRL, lids and conjunctivae normal ENMT: Extensive oral and pharyngeal thrush involving the gums on the side of the mouth, no bleeding, no ulcers observed Neck: normal, supple, no masses, no thyromegaly Respiratory: clear to auscultation bilaterally, no wheezing, no crackles. Normal respiratory effort. No accessory muscle use.  Cardiovascular: Sinus tachycardia no murmurs / rubs / gallops. No extremity edema. 2+ pedal pulses. No carotid bruits.  Abdomen: no tenderness, no masses palpated. No hepatosplenomegaly. Bowel sounds positive.  Musculoskeletal: no clubbing / cyanosis. No joint deformity upper and lower extremities. Good ROM, no contractures. Normal muscle tone.  Skin: no rashes, lesions, ulcers. No induration Neurologic: CN 2-12 grossly intact. Sensation intact, DTR normal. Strength 5/5 in all 4.  Psychiatric: Normal judgment and insight. Alert and oriented x 3. Normal mood.      Labs on Admission: I have personally reviewed following labs and imaging studies  CBC: Recent Labs  Lab 02/09/20 1942 02/09/20 2317  WBC 8.8 7.9  NEUTROABS 5.6  --   HGB 15.7 14.1  HCT 48.6 44.5  MCV 88.8 89.2  PLT 367 017   Basic Metabolic Panel: Recent Labs  Lab 02/09/20 1942 02/09/20 2317  NA 140  --   K 3.5  --   CL 101  --   CO2 23  --   GLUCOSE 125*  --   BUN 12  --   CREATININE 1.10 1.09  CALCIUM 9.6  --    GFR: Estimated Creatinine Clearance: 100 mL/min (by C-G formula based on SCr of 1.09 mg/dL). Liver Function Tests: No results for input(s): AST, ALT, ALKPHOS, BILITOT, PROT, ALBUMIN in the  last 168 hours. No results for input(s): LIPASE, AMYLASE in the last 168 hours. No results for input(s): AMMONIA in the last 168 hours. Coagulation Profile: No results for input(s): INR, PROTIME in the last 168 hours. Cardiac Enzymes: No results for input(s): CKTOTAL, CKMB, CKMBINDEX, TROPONINI in the last 168 hours. BNP (last 3 results) No results for input(s): PROBNP in the last 8760 hours. HbA1C: No results for input(s): HGBA1C in the last 72 hours. CBG: No results for input(s): GLUCAP in the last 168 hours. Lipid Profile: No results for input(s): CHOL, HDL, LDLCALC, TRIG, CHOLHDL, LDLDIRECT in the last 72 hours. Thyroid Function Tests: No results for input(s): TSH, T4TOTAL, FREET4, T3FREE, THYROIDAB in the last 72 hours. Anemia Panel: No results for input(s): VITAMINB12, FOLATE, FERRITIN, TIBC, IRON, RETICCTPCT in the last 72 hours. Urine analysis: No results found for: COLORURINE, APPEARANCEUR, LABSPEC, PHURINE, GLUCOSEU, HGBUR, BILIRUBINUR, KETONESUR, PROTEINUR, UROBILINOGEN, NITRITE, LEUKOCYTESUR Sepsis Labs: @LABRCNTIP (procalcitonin:4,lacticidven:4) )No results found for this or any previous visit (from the past 240 hour(s)).   Radiological Exams on Admission: No results found.    Assessment/Plan Principal Problem:   Candida infection,  esophageal (HCC) Active Problems:   HIV (human immunodeficiency virus infection) (HCC)   Leucocytosis     #1 oropharyngeal and possible candidal esophagitis: Patient is is being admitted.  Already received nystatin and Magic mouthwash in the ER.  Initiating IV Diflucan and continue with the Magic mouthwash.  Awaiting full evaluation of his HIV disease status.  Continue supportive care.  Liquid diet for now until patient is able to swallow  #2 HIV infection: This is new diagnosis.  CD4 count and HIV quantitative RNA has been ordered.  Infectious disease consultation.  Contact resident and further education will be given.  Patient is married for 10 years.  Has reported only 1 sexual partner.     DVT prophylaxis: Lovenox Code Status: Full code Family Communication: No family at bedside Disposition Plan: Home Consults called: Dr. , infectious disease Admission status: Inpatient  Severity of Illness: The appropriate patient status for this patient is INPATIENT. Inpatient status is judged to be reasonable and necessary in order to provide the required intensity of service to ensure the patient's safety. The patient's presenting symptoms, physical exam findings, and initial radiographic and laboratory data in the context of their chronic comorbidities is felt to place them at high risk for further clinical deterioration. Furthermore, it is not anticipated that the patient will be medically stable for discharge from the hospital within 2 midnights of admission. The following factors support the patient status of inpatient.   " The patient's presenting symptoms include dysphagia. " The worrisome physical exam findings include significant oral thrush. " The initial radiographic and laboratory data are worrisome because of HIV seropositive. " The chronic co-morbidities include none.   * I certify that at the point of admission it is my clinical judgment that the patient will require  inpatient hospital care spanning beyond 2 midnights from the point of admission due to high intensity of service, high risk for further deterioration and high frequency of surveillance required.Ninetta Lights MD [facility] Pager [phone]  If 7PM-7AM, please contact night-coverage [email or web address] Password [facility]  02/10/2020, 12:40 AM

## 2020-02-09 NOTE — ED Provider Notes (Signed)
[facility] EMERGENCY DEPARTMENT Provider Note   CSN: [id number] Arrival date & time: 02/09/20  1747     History Chief Complaint  Patient presents with  . Sore Throat    Frank [name] is a 29 y.o. male otherwise healthy no daily medication use presents today for sore throat and mouth pain ongoing x1 week.  He reports severe burning sensation constant worsened with chewing and swallowing no alleviating factors, no radiation of pain.  He has been taking his wife's amoxicillin and hydrocodone for the past 5 days without relief of his symptoms.  He reports he is unable to eat or drink secondary to pain.  He reports he has had ulcers and lesions of his lips, tongue, cheek and throat worsening over the past 5 days.  Associated symptom of tactile fevers.  He reports that he has had similar symptoms in the past but never this severe.    Denies headache, cough, chest pain, shortness of breath, abdominal pain, nausea, vomiting, diarrhea, numbness/tingling, weakness or any additional concerns.  HPI     History reviewed. No pertinent past medical history.  Patient Active Problem List   Diagnosis Date Noted  . HIV (human immunodeficiency virus infection) (HCC) 02/09/2020  . Leucocytosis 02/09/2020  . Candida infection, esophageal (HCC) 02/09/2020    History reviewed. No pertinent surgical history.     No family history on file.  Social History   Tobacco Use  . Smoking status: Current Every Day Smoker  . Smokeless tobacco: Never Used  Substance Use Topics  . Alcohol use: Yes  . Drug use: Yes    Types: Marijuana    Home Medications Prior to Admission medications   Medication Sig Start Date End Date Taking? Authorizing Provider  acetaminophen (TYLENOL) 160 MG/5ML liquid Take 320 mg by mouth every 6 (six) hours as needed for fever.    Yes [provider]  amoxicillin (AMOXIL) 500 MG capsule Take 500 mg by mouth 3 (three) times daily.   Yes [provider]  HYDROcodone-acetaminophen (NORCO/VICODIN) 5-325 MG tablet Take 1 tablet by mouth every 6 (six) hours as needed for moderate pain.   Yes [provider]  Tetrahydrozoline HCl (VISINE OP) Place 1 drop into both eyes daily as needed (For dry eyes).    Yes [provider]  acyclovir (ZOVIRAX) 400 MG tablet Take 1 tablet (400 mg total) by mouth 3 (three) times daily. Patient not taking: Reported on 02/09/2020 09/01/14   Oswaldo Conroy, PA-C  Alum & Mag Hydroxide-Simeth (MAGIC MOUTHWASH) SOLN Take 5 mLs by mouth 3 (three) times daily. Patient not taking: Reported on 02/09/2020 09/01/14   Oswaldo Conroy, PA-C  azithromycin (ZITHROMAX) 250 MG tablet Take 1 tablet (250 mg total) by mouth daily. Take first 2 tablets together, then 1 every day until finished. Patient not taking: Reported on 02/09/2020 09/01/14   Oswaldo Conroy, PA-C  benzocaine (ORABASE-B) 20 % PSTE Use as directed 1 application in the mouth or throat 4 (four) times daily as needed. Apply every 6 hours as needed in tender mouth area. Dispense 12g tube Patient not taking: Reported on 09/03/2014 02/14/13   Moreno-Coll, Adlih, MD  cimetidine (TAGAMET) 300 MG/5ML solution Take 5 mLs (300 mg total) by mouth 2 (two) times daily. Patient not taking: Reported on 09/03/2014 02/14/13   Moreno-Coll, Adlih, MD  ibuprofen (ADVIL,MOTRIN) 600 MG tablet Take 1 tablet (600 mg total) by mouth every 8 (eight) hours as needed for pain or fever. Patient not taking:  Reported on 09/02/2014 02/14/13   Moreno-Coll, Adlih, MD  magic mouthwash w/lidocaine SOLN Take 5 mLs by mouth 4 (four) times daily as needed for mouth pain. Patient not taking: Reported on 02/09/2020 04/24/19   Patterson Hammersmith C, PA-C    Allergies    Patient has no known allergies.  Review of Systems   Review of Systems Ten systems are reviewed and are negative for acute change except as noted in the HPI  Physical Exam Updated Vital Signs BP 125/78   Pulse 67    Temp 99.4 F (37.4 C) (Oral)   Resp 18   Ht 5\' 9"  (1.753 m)   Wt 74.8 kg   SpO2 100%   BMI 24.37 kg/m   Physical Exam Constitutional:      General: He is not in acute distress.    Appearance: Normal appearance. He is well-developed. He is not ill-appearing or diaphoretic.  HENT:     Head: Normocephalic and atraumatic.     Jaw: There is normal jaw occlusion.     Right Ear: External ear normal.     Left Ear: External ear normal.     Nose: Nose normal.     Mouth/Throat:     Comments: Multiple superficial ulcers present to the lips.  Thrush present to tongue, cheeks and roof of mouth.  Patient spitting secretions into bottle at bedside.  Phonation normal.  No stridor.  Uvula midline without edema.  Soft palate rises symmetrically.  Patient has minimal diffuse gingival swelling and scant bleeding present no significant swelling.  No gingival fluctuance or drainage.  Tongue protrusion is normal, floor of mouth is soft. No trismus. No creptius on neck palpation. No gingival erythema or fluctuance noted. Mucus membranes dry. Eyes:     General: Vision grossly intact. Gaze aligned appropriately.     Pupils: Pupils are equal, round, and reactive to light.  Neck:     Trachea: Trachea and phonation normal. No tracheal deviation.  Pulmonary:     Effort: Pulmonary effort is normal. No respiratory distress.  Abdominal:     General: There is no distension.     Palpations: Abdomen is soft.     Tenderness: There is no abdominal tenderness. There is no guarding or rebound.  Musculoskeletal:        General: Normal range of motion.     Cervical back: Normal range of motion.  Skin:    General: Skin is warm and dry.  Neurological:     Mental Status: He is alert.     GCS: GCS eye subscore is 4. GCS verbal subscore is 5. GCS motor subscore is 6.     Comments: Speech is clear and goal oriented, follows commands Major Cranial nerves without deficit, no facial droop Moves extremities without ataxia,  coordination intact  Psychiatric:        Behavior: Behavior normal.     ED Results / Procedures / Treatments   Labs (all labs ordered are listed, but only abnormal results are displayed) Labs Reviewed  CBC WITH DIFFERENTIAL/PLATELET - Abnormal; Notable for the following components:      Result Value   Monocytes Absolute 1.3 (*)    All other components within normal limits  BASIC METABOLIC PANEL - Abnormal; Notable for the following components:   Glucose, Bld 125 (*)    Anion gap 16 (*)    All other components within normal limits  RAPID HIV SCREEN (HIV 1/2 AB+AG) - Abnormal; Notable for the following components:  HIV-1 P24 Antigen - HIV24 Reactive (*)    All other components within normal limits  SARS CORONAVIRUS 2 (TAT 6-24 HRS)  CBC  HIV-1/2 AB - DIFFERENTIATION  T-HELPER CELLS (CD4) COUNT (NOT AT [facility])  HIV-1 RNA QUANT-NO REFLEX-BLD  RPR  CREATININE, SERUM  COMPREHENSIVE METABOLIC PANEL  CBC  GC/CHLAMYDIA PROBE AMP ([place]) NOT AT [facility]    EKG None  Radiology No results found.  Procedures Procedures (including critical care time)  Medications Ordered in ED Medications  enoxaparin (LOVENOX) injection 40 mg (has no administration in time range)  dextrose 5 %-0.9 % sodium chloride infusion ( Intravenous New Bag/Given 02/09/20 2315)  acetaminophen (TYLENOL) tablet 650 mg (has no administration in time range)    Or  acetaminophen (TYLENOL) suppository 650 mg (has no administration in time range)  ondansetron (ZOFRAN) tablet 4 mg (has no administration in time range)    Or  ondansetron (ZOFRAN) injection 4 mg (has no administration in time range)  folic acid (FOLVITE) tablet 1 mg (has no administration in time range)  fluconazole (DIFLUCAN) IVPB 100 mg (has no administration in time range)  magic mouthwash (has no administration in time range)  lidocaine (XYLOCAINE) 2 % viscous mouth solution 15 mL (15 mLs Mouth/Throat Given 02/09/20 1936)  morphine 4 MG/ML  injection 4 mg (4 mg Intravenous Given 02/09/20 2122)  sodium chloride 0.9 % bolus 1,000 mL (0 mLs Intravenous Stopped 02/09/20 2236)  fluconazole (DIFLUCAN) tablet 200 mg (200 mg Oral Given 02/09/20 2145)  magic mouthwash (10 mLs Oral Given 02/09/20 2145)    ED Course  I have reviewed the triage vital signs and the nursing notes.  Pertinent labs & imaging results that were available during my care of the patient were reviewed by me and considered in my medical decision making (see chart for details).  Clinical Course as of Feb 08 2326  Sat Feb 09, 2020  2121 Oral or IV Fluconazole Magic Mouthwash Dr. Johnnye Sima   [BM]  2245 Dr. Jonelle Sidle   [BM]    Clinical Course User Index [BM] Gari Crown   MDM Rules/Calculators/A&P                     29 year old male presents today for mouth and throat pain ongoing x1 week.  Decreased p.o. intake secondary to pain.  Spitting secretions into a bottle at bedside.  Phonation is normal.  Examination reveals intact airway, there is significant thrush to the tongue, cheeks, roof of mouth and tonsils.  There are superficial aphthous ulcers present to the lips.  Patient appears dehydrated on exam uncomfortable.  Will obtain basic blood work including CBC, BMP.  Additionally due to extensiveness of thrush and aphthous ulcers I am concerned for possible HIV/AIDS in this patient, he has no previous diagnosis.  Will order rapid HIV screening test.  There is no evidence of PTA, RPA, Ludwig's, dental abscess or other deep tissue infections of the face head or neck requiring CT imaging at this time.  Suspect patient spitting his secretions due to the pain and discomfort of his thrush and ulcers.  Will give viscous lidocaine mouth rinse for symptoms.  Additional history was obtained from patient's chart, it appears he has been seen in ER in urgent care twice prior for similar symptoms, April 24, 2019 and February 14, 2013. - I have reviewed ordered and interpreted  the following labs.  CBC shows no leukocytosis or anemia.  BMP shows no emergent electrolyte derangement, glucose  slightly elevated at 125, gap of 16 which I suspect is secondary to dehydration.  Rapid HIV test is positive.  Patient was reassessed, minimal improvement following a viscous lidocaine mouth rinse.  Patient seen and evaluated by Dr. Silverio Lay, on reassessment patient was informed of positive HIV test.  Concern for possible AIDS defining illness given the extent of thrush and aphthous ulcers today.  Patient will be admitted for dehydration secondary to decreased p.o. intake from stomatitis/thrush and further work-up.  - 9:21 PM: Discussed case with infectious disease specialist Dr. Ninetta Lights, he advises starting patient on either oral or IV fluconazole, giving patient Magic mouthwash; obtain RPR, CD4 count, GC chlamydia, HIV quantitative; admit to hospitalist service and Dr. Ninetta Lights to see patient in the morning. - On reassessment patient states understanding of care plan and is agreeable for admission, he has no questions at this time.  I have ordered 1 L fluid bolus for patient's dehydration, 200 mg tablet fluconazole for thrush, Magic mouthwash for thrush, 4 mg morphine for pain. - 10:45 PM: Discussed case with hospitalist Dr. Hanley Hays who is accepted patient for admission.  Patient was seen and evaluated by Dr. Silverio Lay during this visit who agrees with admission.  Note: Portions of this report may have been transcribed using voice recognition software. Every effort was made to ensure accuracy; however, inadvertent computerized transcription errors may still be present. Final Clinical Impression(s) / ED Diagnoses Final diagnoses:  Symptomatic HIV infection (HCC)  Thrush  Aphthous ulcer    Rx / DC Orders ED Discharge Orders    None       Elizabeth Palau 02/09/20 2328    Charlynne Pander, MD 02/10/20 [phone]

## 2020-02-10 DIAGNOSIS — B009 Herpesviral infection, unspecified: Secondary | ICD-10-CM

## 2020-02-10 DIAGNOSIS — B3781 Candidal esophagitis: Secondary | ICD-10-CM

## 2020-02-10 DIAGNOSIS — R739 Hyperglycemia, unspecified: Secondary | ICD-10-CM

## 2020-02-10 DIAGNOSIS — B20 Human immunodeficiency virus [HIV] disease: Principal | ICD-10-CM

## 2020-02-10 DIAGNOSIS — F17200 Nicotine dependence, unspecified, uncomplicated: Secondary | ICD-10-CM

## 2020-02-10 LAB — CBC
HCT: 41.5 % (ref 39.0–52.0)
Hemoglobin: 13.2 g/dL (ref 13.0–17.0)
MCH: 28.3 pg (ref 26.0–34.0)
MCHC: 31.8 g/dL (ref 30.0–36.0)
MCV: 89.1 fL (ref 80.0–100.0)
Platelets: 330 10*3/uL (ref 150–400)
RBC: 4.66 MIL/uL (ref 4.22–5.81)
RDW: 12.9 % (ref 11.5–15.5)
WBC: 7.6 10*3/uL (ref 4.0–10.5)
nRBC: 0 % (ref 0.0–0.2)

## 2020-02-10 LAB — COMPREHENSIVE METABOLIC PANEL
ALT: 12 U/L (ref 0–44)
AST: 13 U/L — ABNORMAL LOW (ref 15–41)
Albumin: 3.3 g/dL — ABNORMAL LOW (ref 3.5–5.0)
Alkaline Phosphatase: 48 U/L (ref 38–126)
Anion gap: 11 (ref 5–15)
BUN: 12 mg/dL (ref 6–20)
CO2: 26 mmol/L (ref 22–32)
Calcium: 8.6 mg/dL — ABNORMAL LOW (ref 8.9–10.3)
Chloride: 105 mmol/L (ref 98–111)
Creatinine, Ser: 0.89 mg/dL (ref 0.61–1.24)
GFR calc Af Amer: >60 mL/min (ref >60)
GFR calc non Af Amer: >60 mL/min (ref >60)
Glucose, Bld: 134 mg/dL — ABNORMAL HIGH (ref 70–99)
Potassium: 3.4 mmol/L — ABNORMAL LOW (ref 3.5–5.1)
Sodium: 142 mmol/L (ref 135–145)
Total Bilirubin: 0.8 mg/dL (ref 0.3–1.2)
Total Protein: 7.1 g/dL (ref 6.5–8.1)

## 2020-02-10 LAB — LIPID PANEL
Cholesterol: 158 mg/dL (ref 0–200)
HDL: 28 mg/dL — ABNORMAL LOW (ref >40)
LDL Cholesterol: 117 mg/dL — ABNORMAL HIGH (ref 0–99)
Total CHOL/HDL Ratio: 5.6 RATIO
Triglycerides: 67 mg/dL (ref <150)
VLDL: 13 mg/dL (ref 0–40)

## 2020-02-10 LAB — SARS CORONAVIRUS 2 (TAT 6-24 HRS): SARS Coronavirus 2: NEGATIVE

## 2020-02-10 LAB — MRSA PCR SCREENING: MRSA by PCR: NEGATIVE

## 2020-02-10 LAB — RPR: RPR Ser Ql: NONREACTIVE

## 2020-02-10 MED ORDER — VALACYCLOVIR HCL 500 MG PO TABS
1000.0000 mg | ORAL_TABLET | Freq: Two times a day (BID) | ORAL | Status: DC
  Administered 2020-02-10: 1000 mg via ORAL
  Filled 2020-02-10: qty 2

## 2020-02-10 MED ORDER — FLUCONAZOLE 100 MG PO TABS: 100.0000 mg | ORAL_TABLET | Freq: Every day | ORAL | 0 refills | Status: AC

## 2020-02-10 MED ORDER — VALACYCLOVIR HCL 1 G PO TABS: 1000.0000 mg | ORAL_TABLET | Freq: Two times a day (BID) | ORAL | 0 refills | Status: DC

## 2020-02-10 NOTE — Discharge Summary (Signed)
Physician Discharge Summary  Frank [name] OQH:[id number] DOB: [birth year] DOA: 02/09/2020  PCP: Patient, No Pcp Per  Admit date: 02/09/2020 Discharge date: 02/10/2020  Admitted From: Home Discharge disposition: Home   Recommendations for Outpatient Follow-Up:   1. Patient has follow-up with ID on Tuesday   Discharge Diagnosis:   Principal Problem:   Candida infection, esophageal (HCC) Active Problems:   HIV (human immunodeficiency virus infection) (HCC)   Leucocytosis    Discharge Condition: Improved.  Diet recommendation: As tolerated  Wound care: None.  Code status: Full.   History of Present Illness:   Frank [name] is a 29 y.o. male with medical history significant of no significant past medical history who is happily married for 10 years presenting to the ER with 1 week history of progressive dysphagia and sore throat.  Patient has been having constant burning sensation in his throat when eating or swallowing.  This is going down his chest.  Patient has no idea what is been going on.  He took some amoxicillin as well as hydrocodone in the last 5 days blunting to his wife.  Symptoms have continued to get worse.  He was not able to eat solid food so he came to the ER.  Initial evaluation showed extensive oral thrush with suspicion for esophagitis due to Candida.  Further evaluation showed patient is reactive and is HIV positive.  Patient denied any prior knowledge of these.  He has been married for 10 years and has been with only one partner.  She has 2 children aged 18 and 55 both healthy.  He is not sure where and how he got it.  Does not know partners status at this point.  He has been initiated on antifungals.  Dr. Ninetta Lights of infectious disease has been contacted and will consult on the patient in the morning.  His CD4 and viral load have been ordered and awaiting results at this point.Marland Kitchen   Hospital Course by Problem:   oropharyngeal and possible candidal  esophagitis:  -Patient states he wants to go home and says he is tolerating diet -P.o. Diflucan x2 weeks -Acyclovir x5 days -Magic mouthwash -Follow-up with infectious disease on Tuesday   HIV infection: This is new diagnosis.  CD4 count and HIV quantitative RNA has been ordered.  Infectious disease consultation restated.  Patient has appointment scheduled for Tuesday 8 with Dr. Ninetta Lights    Medical Consultants:   ID   Discharge Exam:   Vitals:   02/10/20 0848 02/10/20 1222  BP: 104/66 111/68  Pulse: 71 81  Resp:  18  Temp:  98.1 F (36.7 C)  SpO2:  97%   Vitals:   02/10/20 0227 02/10/20 0534 02/10/20 0848 02/10/20 1222  BP: 118/75 119/71 104/66 111/68  Pulse: 63 76 71 81  Resp: 18 18  18   Temp: 98.8 F (37.1 C) 99.1 F (37.3 C)  98.1 F (36.7 C)  TempSrc: Oral Oral  Oral  SpO2: 99% 98%  97%  Weight:      Height:        General exam: Appears calm and comfortable.   The results of significant diagnostics from this hospitalization (including imaging, microbiology, ancillary and laboratory) are listed below for reference.     Procedures and Diagnostic Studies:   No results found.   Labs:   Basic Metabolic Panel: Recent Labs  Lab 02/09/20 1942 02/09/20 2317 02/10/20 0500  NA 140  --  142  K 3.5  --  3.4*  CL 101  --  105  CO2 23  --  26  GLUCOSE 125*  --  134*  BUN 12  --  12  CREATININE 1.10 1.09 0.89  CALCIUM 9.6  --  8.6*   GFR Estimated Creatinine Clearance: 122.5 mL/min (by C-G formula based on SCr of 0.89 mg/dL). Liver Function Tests: Recent Labs  Lab 02/10/20 0500  AST 13*  ALT 12  ALKPHOS 48  BILITOT 0.8  PROT 7.1  ALBUMIN 3.3*   No results for input(s): LIPASE, AMYLASE in the last 168 hours. No results for input(s): AMMONIA in the last 168 hours. Coagulation profile No results for input(s): INR, PROTIME in the last 168 hours.  CBC: Recent Labs  Lab 02/09/20 1942 02/09/20 2317 02/10/20 0500  WBC 8.8 7.9 7.6  NEUTROABS  5.6  --   --   HGB 15.7 14.1 13.2  HCT 48.6 44.5 41.5  MCV 88.8 89.2 89.1  PLT 367 328 330   Cardiac Enzymes: No results for input(s): CKTOTAL, CKMB, CKMBINDEX, TROPONINI in the last 168 hours. BNP: Invalid input(s): POCBNP CBG: No results for input(s): GLUCAP in the last 168 hours. D-Dimer No results for input(s): DDIMER in the last 72 hours. Hgb A1c No results for input(s): HGBA1C in the last 72 hours. Lipid Profile Recent Labs    02/10/20 1208  CHOL 158  HDL 28*  LDLCALC 117*  TRIG 67  CHOLHDL 5.6   Thyroid function studies No results for input(s): TSH, T4TOTAL, T3FREE, THYROIDAB in the last 72 hours.  Invalid input(s): FREET3 Anemia work up No results for input(s): VITAMINB12, FOLATE, FERRITIN, TIBC, IRON, RETICCTPCT in the last 72 hours. Microbiology Recent Results (from the past 240 hour(s))  SARS CORONAVIRUS 2 (TAT 6-24 HRS) Nasopharyngeal Nasopharyngeal Swab     Status: None   Collection Time: 02/09/20 10:38 PM   Specimen: Nasopharyngeal Swab  Result Value Ref Range Status   SARS Coronavirus 2 NEGATIVE NEGATIVE Final    Comment: (NOTE) SARS-CoV-2 target nucleic acids are NOT DETECTED. The SARS-CoV-2 RNA is generally detectable in upper and lower respiratory specimens during the acute phase of infection. Negative results do not preclude SARS-CoV-2 infection, do not rule out co-infections with other pathogens, and should not be used as the sole basis for treatment or other patient management decisions. Negative results must be combined with clinical observations, patient history, and epidemiological information. The expected result is Negative. Fact Sheet for Patients: SugarRoll.be Fact Sheet for Healthcare Providers: https://www.woods-mathews.com/ This test is not yet approved or cleared by the [place] FDA and  has been authorized for detection and/or diagnosis of SARS-CoV-2 by FDA under an Emergency Use  Authorization (EUA). This EUA will remain  in effect (meaning this test can be used) for the duration of the COVID-19 declaration under Section 56 4(b)(1) of the Act, 21 U.S.C. section 360bbb-3(b)(1), unless the authorization is terminated or revoked sooner. Performed at [facility], [place] [address], [place], [place] [address]   MRSA PCR Screening     Status: None   Collection Time: 02/10/20  5:32 AM   Specimen: Nasal Mucosa; Nasopharyngeal  Result Value Ref Range Status   MRSA by PCR NEGATIVE NEGATIVE Final    Comment:        The GeneXpert MRSA Assay (FDA approved for NASAL specimens only), is one component of a comprehensive MRSA colonization surveillance program. It is not intended to diagnose MRSA infection nor to guide or monitor treatment for MRSA infections. Performed  at [facility], [address], [place], [place] [address]      Discharge Instructions:   Discharge Instructions    Discharge instructions   Complete by: As directed    Eat and drink foods that you can tolerate- advance your diet as you improve Keep your appointment with Dr. Ninetta Lights on Tuesday Take your diflucan   Increase activity slowly   Complete by: As directed      Allergies as of 02/10/2020   No Known Allergies     Medication List    STOP taking these medications   acyclovir 400 MG tablet Commonly known as: ZOVIRAX   amoxicillin 500 MG capsule Commonly known as: AMOXIL   azithromycin 250 MG tablet Commonly known as: ZITHROMAX   benzocaine 20 % Pste Commonly known as: ORABASE-B   cimetidine 300 MG/5ML solution Commonly known as: TAGAMET   HYDROcodone-acetaminophen 5-325 MG tablet Commonly known as: NORCO/VICODIN   ibuprofen 600 MG tablet Commonly known as: ADVIL   magic mouthwash Soln     TAKE these medications   acetaminophen 160 MG/5ML liquid Commonly known as: TYLENOL Take 320 mg by mouth every 6 (six) hours as needed for fever.   fluconazole 100 MG  tablet Commonly known as: Diflucan Take 1 tablet (100 mg total) by mouth daily.   magic mouthwash w/lidocaine Soln Take 5 mLs by mouth 4 (four) times daily as needed for mouth pain.   valACYclovir 1000 MG tablet Commonly known as: VALTREX Take 1 tablet (1,000 mg total) by mouth 2 (two) times daily.   VISINE OP Place 1 drop into both eyes daily as needed (For dry eyes).      Follow-up Information    Ginnie Smart, MD Follow up.   Specialty: Infectious Diseases Why: Tuesday 02-12-20 at 1:30 Contact information: [address] [place] [address] [phone]            Time coordinating discharge: 35 min  Signed:  Joseph Art DO  [facility] 02/10/2020, 2:29 PM

## 2020-02-10 NOTE — ED Notes (Addendum)
ED RN attempted report however, nurse is unavalible and her phone was left at the charge desk, therefore, the other nurse stated she did not know when she would be back to here phone and requested ED RNs Number.

## 2020-02-10 NOTE — ED Notes (Signed)
Report attempted to [facility] however, RN is at lunch, and charge nurse is unable to take report at this time. ED Charge RN made aware. Will attempt after nurses lunch break.

## 2020-02-10 NOTE — Progress Notes (Signed)
IV removed, catheter intact  Pt discharge education provided at bedside  Pt has all belongings  Pt discharged via wheelchair with NT

## 2020-02-10 NOTE — Care Management (Signed)
Provided with MATCH letter.  Reviewed with patient that he will follow up w [facility] who will arrange for assistance w HIV meds.

## 2020-02-10 NOTE — Consult Note (Signed)
Frank [name] for Infectious Disease    Date of Admission:  02/09/2020   Total days of antibiotics: 1 flucon               Reason for Consult: AIDS, thrush    Referring Provider: Eliseo Squires   Assessment: AIDS Thrush Oral HSV Hyperglycemia  Plan: 1. Continued diflucan 2. Continue magic mouthwash 3. Start valtrex 4. Check GC/chlamydia 5. Check HLA [id number] 6. Check lipids 7. Check HIV RNA with genotype 8. Will have him seen in our clinic with me asap (Tuesday 02-12-20 at 1:30)  Thank you so much for this interesting consult,  Principal Problem:   Candida infection, esophageal ([place]) Active Problems:   HIV (human immunodeficiency virus infection) ([place])   Leucocytosis   . enoxaparin (LOVENOX) injection  40 mg Subcutaneous Daily  . folic acid  1 mg Oral Daily    HPI: Frank [name] is a 29 y.o. male without pmhx comes to ED with 1 week of oral pain and sores. He has lost about 5 # during this period.  He was found to be HIV+ and to have thrush.  He was adm, started on magic mouthwash and diflucan.   He states he is married and has not disclosed to his wife (yet).  Small amt of ETOH and marijuana use.  Denies hx of prev STIs  Review of Systems: Review of Systems  Constitutional: Positive for weight loss. Negative for chills and fever.  HENT: Positive for sore throat.   Eyes: Negative for blurred vision.  Respiratory: Negative for cough.   Gastrointestinal: Negative for constipation and diarrhea.  Please see HPI. All other systems reviewed and negative.   History reviewed. No pertinent past medical history.  Social History   Tobacco Use  . Smoking status: Current Every Day Smoker  . Smokeless tobacco: Never Used  Substance Use Topics  . Alcohol use: Yes  . Drug use: Yes    Types: Marijuana    No family history on file.   Medications:  Scheduled: . enoxaparin (LOVENOX) injection  40 mg Subcutaneous Daily  . folic acid  1 mg Oral Daily    Abtx:   Anti-infectives (From admission, onward)   Start     Dose/Rate Route Frequency Ordered Stop   02/10/20 2200  fluconazole (DIFLUCAN) IVPB 100 mg     100 mg 50 mL/hr over 60 Minutes Intravenous Daily at bedtime 02/09/20 2306     02/09/20 2130  fluconazole (DIFLUCAN) tablet 200 mg     200 mg Oral  Once 02/09/20 2127 02/09/20 2145        OBJECTIVE: Blood pressure 104/66, pulse 71, temperature 99.1 F (37.3 C), temperature source Oral, resp. rate 18, height 5' 9"  (1.753 m), weight 74.8 kg, SpO2 98 %.  Physical Exam Constitutional:      General: He is not in acute distress.    Appearance: He is well-developed and normal weight. He is not ill-appearing, toxic-appearing or diaphoretic.  HENT:     Head: Normocephalic and atraumatic.     Mouth/Throat:     Mouth: Mucous membranes are moist. Oral lesions present.  Eyes:     Conjunctiva/sclera: Conjunctivae normal.     Pupils: Pupils are equal, round, and reactive to light.  Cardiovascular:     Rate and Rhythm: Normal rate and regular rhythm.     Heart sounds: Normal heart sounds.  Abdominal:     General: Bowel sounds are normal. There is  no distension.     Palpations: Abdomen is soft.     Tenderness: There is no abdominal tenderness.  Musculoskeletal:     Cervical back: Normal range of motion and neck supple.     Right lower leg: No edema.     Left lower leg: No edema.  Skin:    General: Skin is warm and dry.  Neurological:     General: No focal deficit present.     Mental Status: He is alert.  Psychiatric:        Mood and Affect: Mood normal.     Lab Results Results for orders placed or performed during the hospital encounter of 02/09/20 (from the past 48 hour(s))  CBC with Differential     Status: Abnormal   Collection Time: 02/09/20  7:42 PM  Result Value Ref Range   WBC 8.8 4.0 - 10.5 K/uL   RBC 5.47 4.22 - 5.81 MIL/uL   Hemoglobin 15.7 13.0 - 17.0 g/dL   HCT 48.6 39.0 - 52.0 %   MCV 88.8 80.0 - 100.0 fL   MCH 28.7  26.0 - 34.0 pg   MCHC 32.3 30.0 - 36.0 g/dL   RDW 12.7 11.5 - 15.5 %   Platelets 367 150 - 400 K/uL   nRBC 0.0 0.0 - 0.2 %   Neutrophils Relative % 63 %   Neutro Abs 5.6 1.7 - 7.7 K/uL   Lymphocytes Relative 21 %   Lymphs Abs 1.9 0.7 - 4.0 K/uL   Monocytes Relative 15 %   Monocytes Absolute 1.3 (H) 0.1 - 1.0 K/uL   Eosinophils Relative 0 %   Eosinophils Absolute 0.0 0.0 - 0.5 K/uL   Basophils Relative 1 %   Basophils Absolute 0.0 0.0 - 0.1 K/uL   Immature Granulocytes 0 %   Abs Immature Granulocytes 0.03 0.00 - 0.07 K/uL    Comment: Performed at [facility], [address], [place], [place] [address]  Basic metabolic panel     Status: Abnormal   Collection Time: 02/09/20  7:42 PM  Result Value Ref Range   Sodium 140 135 - 145 mmol/L   Potassium 3.5 3.5 - 5.1 mmol/L   Chloride 101 98 - 111 mmol/L   CO2 23 22 - 32 mmol/L   Glucose, Bld 125 (H) 70 - 99 mg/dL    Comment: Glucose reference range applies only to samples taken after fasting for at least 8 hours.   BUN 12 6 - 20 mg/dL   Creatinine, Ser 1.10 0.61 - 1.24 mg/dL   Calcium 9.6 8.9 - 10.3 mg/dL   GFR calc non Af Amer >60 >60 mL/min   GFR calc Af Amer >60 >60 mL/min   Anion gap 16 (H) 5 - 15    Comment: Performed at [facility] [address] [place], [place] [address]  Rapid HIV screen (HIV 1/2 Ab+Ag)     Status: Abnormal   Collection Time: 02/09/20  7:42 PM  Result Value Ref Range   HIV-1 P24 Antigen - HIV24 Reactive (A) NON REACTIVE    Comment: (NOTE) Detection of p24 may be inhibited by biotin in the sample, causing false negative results in acute infection.    HIV 1/2 Antibodies NON REACTIVE NON REACTIVE   Interpretation (HIV Ag Ab)      A reactive test result means that HIV 1 p24 antigen has been detected in the specimen. The test result is interpreted as Preliminary Positive for HIV 1 p24 antigen.  Comment: SENT FOR CONFIRMATION Performed at [facility], [place] [address] [place],  [place] [address]   RPR     Status: None   Collection Time: 02/09/20  7:42 PM  Result Value Ref Range   RPR Ser Ql NON REACTIVE NON REACTIVE    Comment: Performed at [facility], [place] [address] [place], [place] [address]  SARS CORONAVIRUS 2 (TAT 6-24 HRS) Nasopharyngeal Nasopharyngeal Swab     Status: None   Collection Time: 02/09/20 10:38 PM   Specimen: Nasopharyngeal Swab  Result Value Ref Range   SARS Coronavirus 2 NEGATIVE NEGATIVE    Comment: (NOTE) SARS-CoV-2 target nucleic acids are NOT DETECTED. The SARS-CoV-2 RNA is generally detectable in upper and lower respiratory specimens during the acute phase of infection. Negative results do not preclude SARS-CoV-2 infection, do not rule out co-infections with other pathogens, and should not be used as the sole basis for treatment or other patient management decisions. Negative results must be combined with clinical observations, patient history, and epidemiological information. The expected result is Negative. Fact Sheet for Patients: SugarRoll.be Fact Sheet for Healthcare Providers: https://www.woods-mathews.com/ This test is not yet approved or cleared by the [place] FDA and  has been authorized for detection and/or diagnosis of SARS-CoV-2 by FDA under an Emergency Use Authorization (EUA). This EUA will remain  in effect (meaning this test can be used) for the duration of the COVID-19 declaration under Section 56 4(b)(1) of the Act, 21 U.S.C. section 360bbb-3(b)(1), unless the authorization is terminated or revoked sooner. Performed at [facility], [place] [address] [place] [address]   CBC     Status: None   Collection Time: 02/09/20 11:17 PM  Result Value Ref Range   WBC 7.9 4.0 - 10.5 K/uL   RBC 4.99 4.22 - 5.81 MIL/uL   Hemoglobin 14.1 13.0 - 17.0 g/dL   HCT 44.5 39.0 - 52.0 %   MCV 89.2 80.0 - 100.0 fL   MCH 28.3 26.0 - 34.0 pg   MCHC 31.7 30.0 - 36.0 g/dL    RDW 12.8 11.5 - 15.5 %   Platelets 328 150 - 400 K/uL   nRBC 0.0 0.0 - 0.2 %    Comment: Performed at [facility], [place] [address] [place], [place] [address]  Creatinine, serum     Status: None   Collection Time: 02/09/20 11:17 PM  Result Value Ref Range   Creatinine, Ser 1.09 0.61 - 1.24 mg/dL   GFR calc non Af Amer >60 >60 mL/min   GFR calc Af Amer >60 >60 mL/min    Comment: Performed at [facility] [address] [place], [place] [address]  Comprehensive metabolic panel     Status: Abnormal   Collection Time: 02/10/20  5:00 AM  Result Value Ref Range   Sodium 142 135 - 145 mmol/L   Potassium 3.4 (L) 3.5 - 5.1 mmol/L   Chloride 105 98 - 111 mmol/L   CO2 26 22 - 32 mmol/L   Glucose, Bld 134 (H) 70 - 99 mg/dL    Comment: Glucose reference range applies only to samples taken after fasting for at least 8 hours.   BUN 12 6 - 20 mg/dL   Creatinine, Ser 0.89 0.61 - 1.24 mg/dL   Calcium 8.6 (L) 8.9 - 10.3 mg/dL   Total Protein 7.1 6.5 - 8.1 g/dL   Albumin 3.3 (L) 3.5 - 5.0 g/dL   AST 13 (L) 15 -  41 U/L   ALT 12 0 - 44 U/L   Alkaline Phosphatase 48 38 - 126 U/L   Total Bilirubin 0.8 0.3 - 1.2 mg/dL   GFR calc non Af Amer >60 >60 mL/min   GFR calc Af Amer >60 >60 mL/min   Anion gap 11 5 - 15    Comment: Performed at [facility] [address], [place] [address]  CBC     Status: None   Collection Time: 02/10/20  5:00 AM  Result Value Ref Range   WBC 7.6 4.0 - 10.5 K/uL   RBC 4.66 4.22 - 5.81 MIL/uL   Hemoglobin 13.2 13.0 - 17.0 g/dL   HCT 41.5 39.0 - 52.0 %   MCV 89.1 80.0 - 100.0 fL   MCH 28.3 26.0 - 34.0 pg   MCHC 31.8 30.0 - 36.0 g/dL   RDW 12.9 11.5 - 15.5 %   Platelets 330 150 - 400 K/uL   nRBC 0.0 0.0 - 0.2 %    Comment: Performed at [facility], [place] [address] [place], [place] [address]  MRSA PCR Screening     Status: None   Collection Time: 02/10/20  5:32 AM   Specimen: Nasal Mucosa; Nasopharyngeal  Result Value Ref Range    MRSA by PCR NEGATIVE NEGATIVE    Comment:        The GeneXpert MRSA Assay (FDA approved for NASAL specimens only), is one component of a comprehensive MRSA colonization surveillance program. It is not intended to diagnose MRSA infection nor to guide or monitor treatment for MRSA infections. Performed at [facility], [place] [address], [place], [place] [address]       Component Value Date/Time   SDES THROAT 09/01/2014 0919   SPECREQUEST NONE 09/01/2014 0919   CULT  09/01/2014 0919    No Beta Hemolytic Streptococci Isolated Performed at [facility] 09/03/2014 FINAL 09/01/2014 0919   No results found. Recent Results (from the past 240 hour(s))  SARS CORONAVIRUS 2 (TAT 6-24 HRS) Nasopharyngeal Nasopharyngeal Swab     Status: None   Collection Time: 02/09/20 10:38 PM   Specimen: Nasopharyngeal Swab  Result Value Ref Range Status   SARS Coronavirus 2 NEGATIVE NEGATIVE Final    Comment: (NOTE) SARS-CoV-2 target nucleic acids are NOT DETECTED. The SARS-CoV-2 RNA is generally detectable in upper and lower respiratory specimens during the acute phase of infection. Negative results do not preclude SARS-CoV-2 infection, do not rule out co-infections with other pathogens, and should not be used as the sole basis for treatment or other patient management decisions. Negative results must be combined with clinical observations, patient history, and epidemiological information. The expected result is Negative. Fact Sheet for Patients: SugarRoll.be Fact Sheet for Healthcare Providers: https://www.woods-mathews.com/ This test is not yet approved or cleared by the [place] FDA and  has been authorized for detection and/or diagnosis of SARS-CoV-2 by FDA under an Emergency Use Authorization (EUA). This EUA will remain  in effect (meaning this test can be used) for the duration of the COVID-19 declaration under Section  56 4(b)(1) of the Act, 21 U.S.C. section 360bbb-3(b)(1), unless the authorization is terminated or revoked sooner. Performed at [facility], [place] [address] [place], [place] [address]   MRSA PCR Screening     Status: None   Collection Time: 02/10/20  5:32 AM   Specimen: Nasal Mucosa; Nasopharyngeal  Result Value Ref Range Status   MRSA by  PCR NEGATIVE NEGATIVE Final    Comment:        The GeneXpert MRSA Assay (FDA approved for NASAL specimens only), is one component of a comprehensive MRSA colonization surveillance program. It is not intended to diagnose MRSA infection nor to guide or monitor treatment for MRSA infections. Performed at [facility], [place] [address], [place], [place] [address]     Microbiology: Recent Results (from the past 240 hour(s))  SARS CORONAVIRUS 2 (TAT 6-24 HRS) Nasopharyngeal Nasopharyngeal Swab     Status: None   Collection Time: 02/09/20 10:38 PM   Specimen: Nasopharyngeal Swab  Result Value Ref Range Status   SARS Coronavirus 2 NEGATIVE NEGATIVE Final    Comment: (NOTE) SARS-CoV-2 target nucleic acids are NOT DETECTED. The SARS-CoV-2 RNA is generally detectable in upper and lower respiratory specimens during the acute phase of infection. Negative results do not preclude SARS-CoV-2 infection, do not rule out co-infections with other pathogens, and should not be used as the sole basis for treatment or other patient management decisions. Negative results must be combined with clinical observations, patient history, and epidemiological information. The expected result is Negative. Fact Sheet for Patients: SugarRoll.be Fact Sheet for Healthcare Providers: https://www.woods-mathews.com/ This test is not yet approved or cleared by the [place] FDA and  has been authorized for detection and/or diagnosis of SARS-CoV-2 by FDA under an Emergency Use Authorization (EUA). This EUA will remain  in  effect (meaning this test can be used) for the duration of the COVID-19 declaration under Section 56 4(b)(1) of the Act, 21 U.S.C. section 360bbb-3(b)(1), unless the authorization is terminated or revoked sooner. Performed at [facility], [place] [address], [place], [place] [address]   MRSA PCR Screening     Status: None   Collection Time: 02/10/20  5:32 AM   Specimen: Nasal Mucosa; Nasopharyngeal  Result Value Ref Range Status   MRSA by PCR NEGATIVE NEGATIVE Final    Comment:        The GeneXpert MRSA Assay (FDA approved for NASAL specimens only), is one component of a comprehensive MRSA colonization surveillance program. It is not intended to diagnose MRSA infection nor to guide or monitor treatment for MRSA infections. Performed at [facility], [place] [address] [place], [place] [address]     Radiographs and labs were personally reviewed by me.   Bobby Rumpf, MD [facility] [phone] 02/10/2020, 11:32 AM

## 2020-02-11 ENCOUNTER — Telehealth: Payer: Self-pay | Admitting: *Deleted

## 2020-02-11 LAB — GC/CHLAMYDIA PROBE AMP (~~LOC~~) NOT AT ARMC
Chlamydia: NEGATIVE
Comment: NEGATIVE
Comment: NORMAL
Neisseria Gonorrhea: NEGATIVE

## 2020-02-11 LAB — HIV-1 RNA QUANT-NO REFLEX-BLD
HIV 1 RNA Quant: <20 copies/mL
LOG10 HIV-1 RNA: UNDETERMINED log10copy/mL

## 2020-02-11 LAB — T-HELPER CELLS (CD4) COUNT (NOT AT ARMC)
CD4 % Helper T Cell: 41 % (ref 33–65)
CD4 T Cell Abs: 947 /uL (ref 400–1790)

## 2020-02-11 LAB — HIV-1 RNA, PCR (GRAPH) RFX/GENO EDI

## 2020-02-11 NOTE — Telephone Encounter (Signed)
Faxing information to [facility] per Dr Ninetta Lights. Andree Coss, RN

## 2020-02-11 NOTE — Telephone Encounter (Signed)
Patient advised during hospitalization by Dr Ninetta Lights to come to clinic 02/12/20, 1:45. RN placed patient on schedule. Andree Coss, RN

## 2020-02-12 ENCOUNTER — Ambulatory Visit: Payer: Self-pay | Admitting: Pharmacist

## 2020-02-12 ENCOUNTER — Encounter: Payer: Self-pay | Admitting: Infectious Diseases

## 2020-02-12 ENCOUNTER — Other Ambulatory Visit: Payer: Self-pay

## 2020-02-12 ENCOUNTER — Ambulatory Visit (INDEPENDENT_AMBULATORY_CARE_PROVIDER_SITE_OTHER): Payer: Self-pay | Admitting: Infectious Diseases

## 2020-02-12 ENCOUNTER — Other Ambulatory Visit: Payer: Self-pay | Admitting: *Deleted

## 2020-02-12 VITALS — BP 116/72 | HR 80 | Temp 98.2°F | Ht 69.0 in | Wt 148.0 lb

## 2020-02-12 DIAGNOSIS — K1379 Other lesions of oral mucosa: Secondary | ICD-10-CM

## 2020-02-12 DIAGNOSIS — B3781 Candidal esophagitis: Secondary | ICD-10-CM

## 2020-02-12 MED ORDER — MAGIC MOUTHWASH W/LIDOCAINE: 5.0000 mL | Freq: Four times a day (QID) | ORAL | 0 refills | Status: DC | PRN

## 2020-02-12 NOTE — Telephone Encounter (Signed)
Per Tammy Sours, confirmatory testing negative. HIV was false positive. RN notified Melton Alar at [facility].  Dr Ninetta Lights aware, will notify patient at his appointment this afternoon. Andree Coss, RN

## 2020-02-12 NOTE — Assessment & Plan Note (Signed)
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  Could also be aphthous.  His thrush resvoled quickly and I do not see further evidence.  His immune system seems intact- his CD4 is normal and his HIV is (-).   I asked him to call if oral ulcers do not resolve.  O/w will see him prn.

## 2020-02-12 NOTE — Progress Notes (Signed)
   Subjective:    Patient ID: Frank [name], male    DOB: 18-Sep-1991, 30 y.o.   MRN: [id number]  HPI 29 y.o. male without pmhx comes to ED with 1 week of oral pain and sores. He has lost about 5 # during this period.  He was found to be HIV+ screening and to have thrush.  He was adm, started on magic mouthwash and diflucan.   He has been eating better. He still has oral ulcers (lips) but is eating better.  Would like magic mouthwash.   His HIV RNA was (-).  His CD4 is normal.  Exercises regularly at work Cytogeneticist).   Review of Systems  Constitutional: Positive for chills. Negative for appetite change, fever and unexpected weight change.  HENT: Positive for mouth sores. Negative for trouble swallowing.   Respiratory: Negative for cough and shortness of breath.   Gastrointestinal: Negative for constipation and diarrhea.  Genitourinary: Negative for difficulty urinating.       Objective:   Physical Exam Constitutional:      General: He is not in acute distress.    Appearance: Normal appearance. He is not ill-appearing.  HENT:     Mouth/Throat:     Mouth: Mucous membranes are moist. Oral lesions present.     Tongue: No lesions.     Pharynx: No oropharyngeal exudate.  Eyes:     Extraocular Movements: Extraocular movements intact.     Pupils: Pupils are equal, round, and reactive to light.  Cardiovascular:     Rate and Rhythm: Normal rate and regular rhythm.  Pulmonary:     Effort: Pulmonary effort is normal.     Breath sounds: Normal breath sounds.  Abdominal:     General: Bowel sounds are normal. There is no distension.     Palpations: Abdomen is soft.     Tenderness: There is no abdominal tenderness.  Musculoskeletal:     Cervical back: Normal range of motion and neck supple.     Right lower leg: No edema.     Left lower leg: No edema.  Neurological:     General: No focal deficit present.     Mental Status: He is alert.  Psychiatric:        Mood and Affect:  Mood normal.           Assessment & Plan:

## 2020-02-13 LAB — HIV-1/2 AB - DIFFERENTIATION
HIV 1 Ab: NEGATIVE
HIV 2 Ab: NEGATIVE
Note: NEGATIVE

## 2020-02-13 LAB — RNA QUALITATIVE: HIV 1 RNA Qualitative: 1

## 2020-02-14 LAB — GENOSURE INTEGRASE HIV EDI

## 2020-02-18 LAB — HLA B*5701: HLA B 5701: NEGATIVE

## 2020-09-14 ENCOUNTER — Emergency Department (HOSPITAL_COMMUNITY): Payer: Medicaid Other

## 2020-09-14 ENCOUNTER — Inpatient Hospital Stay (HOSPITAL_COMMUNITY): Payer: Medicaid Other

## 2020-09-14 ENCOUNTER — Encounter (HOSPITAL_COMMUNITY)
Admission: EM | Disposition: A | Discharge status: Home / Self Care | Payer: Self-pay | Source: Home / Self Care | Attending: Orthopaedic Surgery

## 2020-09-14 ENCOUNTER — Other Ambulatory Visit: Payer: Self-pay

## 2020-09-14 ENCOUNTER — Emergency Department (HOSPITAL_COMMUNITY): Payer: Medicaid Other | Admitting: Certified Registered Nurse Anesthetist

## 2020-09-14 ENCOUNTER — Encounter (HOSPITAL_COMMUNITY): Payer: Self-pay | Admitting: Emergency Medicine

## 2020-09-14 ENCOUNTER — Inpatient Hospital Stay (HOSPITAL_COMMUNITY)
Admission: EM | Admit: 2020-09-14 | Discharge: 2020-09-15 | DRG: 482 | Disposition: A | Discharge status: Home / Self Care | Payer: Medicaid Other | Attending: Orthopaedic Surgery | Admitting: Orthopaedic Surgery

## 2020-09-14 DIAGNOSIS — Z880 Allergy status to penicillin: Secondary | ICD-10-CM | POA: Diagnosis not present

## 2020-09-14 DIAGNOSIS — Z23 Encounter for immunization: Secondary | ICD-10-CM | POA: Diagnosis not present

## 2020-09-14 DIAGNOSIS — Y9229 Other specified public building as the place of occurrence of the external cause: Secondary | ICD-10-CM

## 2020-09-14 DIAGNOSIS — S72355B Nondisplaced comminuted fracture of shaft of left femur, initial encounter for open fracture type I or II: Secondary | ICD-10-CM

## 2020-09-14 DIAGNOSIS — Y998 Other external cause status: Secondary | ICD-10-CM | POA: Diagnosis not present

## 2020-09-14 DIAGNOSIS — Y249XXA Unspecified firearm discharge, undetermined intent, initial encounter: Secondary | ICD-10-CM

## 2020-09-14 DIAGNOSIS — S72302A Unspecified fracture of shaft of left femur, initial encounter for closed fracture: Secondary | ICD-10-CM

## 2020-09-14 DIAGNOSIS — W3400XA Accidental discharge from unspecified firearms or gun, initial encounter: Secondary | ICD-10-CM | POA: Insufficient documentation

## 2020-09-14 DIAGNOSIS — Y9389 Activity, other specified: Secondary | ICD-10-CM | POA: Diagnosis not present

## 2020-09-14 DIAGNOSIS — Z20822 Contact with and (suspected) exposure to covid-19: Secondary | ICD-10-CM | POA: Diagnosis present

## 2020-09-14 DIAGNOSIS — S72352A Displaced comminuted fracture of shaft of left femur, initial encounter for closed fracture: Principal | ICD-10-CM

## 2020-09-14 DIAGNOSIS — M79652 Pain in left thigh: Secondary | ICD-10-CM | POA: Diagnosis present

## 2020-09-14 DIAGNOSIS — Z9889 Other specified postprocedural states: Secondary | ICD-10-CM

## 2020-09-14 DIAGNOSIS — Z419 Encounter for procedure for purposes other than remedying health state, unspecified: Secondary | ICD-10-CM

## 2020-09-14 HISTORY — PX: FEMUR IM NAIL: SHX1597

## 2020-09-14 LAB — URINALYSIS, ROUTINE W REFLEX MICROSCOPIC
Bacteria, UA: NONE SEEN
Bilirubin Urine: NEGATIVE
Glucose, UA: NEGATIVE mg/dL
Ketones, ur: NEGATIVE mg/dL
Leukocytes,Ua: NEGATIVE
Nitrite: NEGATIVE
Protein, ur: NEGATIVE mg/dL
Specific Gravity, Urine: 1.014 (ref 1.005–1.030)
pH: 6 (ref 5.0–8.0)

## 2020-09-14 LAB — COMPREHENSIVE METABOLIC PANEL
ALT: 18 U/L (ref 0–44)
AST: 25 U/L (ref 15–41)
Albumin: 4.6 g/dL (ref 3.5–5.0)
Alkaline Phosphatase: 51 U/L (ref 38–126)
Anion gap: 13 (ref 5–15)
BUN: 10 mg/dL (ref 6–20)
CO2: 23 mmol/L (ref 22–32)
Calcium: 9.3 mg/dL (ref 8.9–10.3)
Chloride: 104 mmol/L (ref 98–111)
Creatinine, Ser: 1.11 mg/dL (ref 0.61–1.24)
GFR, Estimated: >60 mL/min (ref >60)
Glucose, Bld: 151 mg/dL — ABNORMAL HIGH (ref 70–99)
Potassium: 3.4 mmol/L — ABNORMAL LOW (ref 3.5–5.1)
Sodium: 140 mmol/L (ref 135–145)
Total Bilirubin: 0.4 mg/dL (ref 0.3–1.2)
Total Protein: 7.9 g/dL (ref 6.5–8.1)

## 2020-09-14 LAB — I-STAT CHEM 8, ED
BUN: 10 mg/dL (ref 6–20)
Calcium, Ion: 1.14 mmol/L — ABNORMAL LOW (ref 1.15–1.40)
Chloride: 101 mmol/L (ref 98–111)
Creatinine, Ser: 1.2 mg/dL (ref 0.61–1.24)
Glucose, Bld: 116 mg/dL — ABNORMAL HIGH (ref 70–99)
HCT: 40 % (ref 39.0–52.0)
Hemoglobin: 13.6 g/dL (ref 13.0–17.0)
Potassium: 3.6 mmol/L (ref 3.5–5.1)
Sodium: 143 mmol/L (ref 135–145)
TCO2: 23 mmol/L (ref 22–32)

## 2020-09-14 LAB — RESPIRATORY PANEL BY RT PCR (FLU A&B, COVID)
Influenza A by PCR: NEGATIVE
Influenza B by PCR: NEGATIVE
SARS Coronavirus 2 by RT PCR: NEGATIVE

## 2020-09-14 LAB — CBC
HCT: 46.8 % (ref 39.0–52.0)
Hemoglobin: 14.1 g/dL (ref 13.0–17.0)
MCH: 28.4 pg (ref 26.0–34.0)
MCHC: 30.1 g/dL (ref 30.0–36.0)
MCV: 94.2 fL (ref 80.0–100.0)
Platelets: 258 10*3/uL (ref 150–400)
RBC: 4.97 MIL/uL (ref 4.22–5.81)
RDW: 13.8 % (ref 11.5–15.5)
WBC: 9.9 10*3/uL (ref 4.0–10.5)
nRBC: 0 % (ref 0.0–0.2)

## 2020-09-14 LAB — LACTIC ACID, PLASMA
Lactic Acid, Venous: 4.1 mmol/L (ref 0.5–1.9)
Lactic Acid, Venous: 1.9 mmol/L (ref 0.5–1.9)

## 2020-09-14 LAB — SAMPLE TO BLOOD BANK

## 2020-09-14 LAB — PROTIME-INR
INR: 1 (ref 0.8–1.2)
Prothrombin Time: 12.9 seconds (ref 11.4–15.2)

## 2020-09-14 LAB — ETHANOL: Alcohol, Ethyl (B): 233 mg/dL — ABNORMAL HIGH (ref <10)

## 2020-09-14 SURGERY — INSERTION, INTRAMEDULLARY ROD, FEMUR, RETROGRADE
Anesthesia: General | Laterality: Left

## 2020-09-14 MED ORDER — METHOCARBAMOL 1000 MG/10ML IJ SOLN
500.0000 mg | Freq: Four times a day (QID) | INTRAVENOUS | Status: DC | PRN
  Filled 2020-09-14: qty 5

## 2020-09-14 MED ORDER — IOHEXOL 350 MG/ML SOLN
100.0000 mL | Freq: Once | INTRAVENOUS | Status: AC | PRN
  Administered 2020-09-14: 100 mL via INTRAVENOUS

## 2020-09-14 MED ORDER — ROCURONIUM BROMIDE 10 MG/ML (PF) SYRINGE
PREFILLED_SYRINGE | INTRAVENOUS | Status: DC | PRN
  Administered 2020-09-14: 50 mg via INTRAVENOUS

## 2020-09-14 MED ORDER — SUGAMMADEX SODIUM 200 MG/2ML IV SOLN
INTRAVENOUS | Status: DC | PRN
  Administered 2020-09-14: 180 mg via INTRAVENOUS

## 2020-09-14 MED ORDER — DIPHENHYDRAMINE HCL 12.5 MG/5ML PO ELIX: 12.5000 mg | ORAL_SOLUTION | ORAL | Status: DC | PRN

## 2020-09-14 MED ORDER — HYDROMORPHONE HCL 1 MG/ML IJ SOLN
INTRAMUSCULAR | Status: AC
  Filled 2020-09-14: qty 1

## 2020-09-14 MED ORDER — PHENYLEPHRINE 40 MCG/ML (10ML) SYRINGE FOR IV PUSH (FOR BLOOD PRESSURE SUPPORT)
PREFILLED_SYRINGE | INTRAVENOUS | Status: DC | PRN
  Administered 2020-09-14: 80 ug via INTRAVENOUS

## 2020-09-14 MED ORDER — BUPIVACAINE-EPINEPHRINE 0.5% -1:200000 IJ SOLN
INTRAMUSCULAR | Status: AC
  Filled 2020-09-14: qty 1

## 2020-09-14 MED ORDER — ACETAMINOPHEN 325 MG PO TABS: 325.0000 mg | ORAL_TABLET | Freq: Four times a day (QID) | ORAL | Status: DC | PRN

## 2020-09-14 MED ORDER — HYDROMORPHONE HCL 1 MG/ML IJ SOLN
0.2500 mg | INTRAMUSCULAR | Status: DC | PRN
  Administered 2020-09-14 (×2): 0.25 mg via INTRAVENOUS

## 2020-09-14 MED ORDER — PROPOFOL 10 MG/ML IV BOLUS
INTRAVENOUS | Status: AC
  Filled 2020-09-14: qty 20

## 2020-09-14 MED ORDER — GABAPENTIN 100 MG PO CAPS
100.0000 mg | ORAL_CAPSULE | Freq: Three times a day (TID) | ORAL | Status: DC
  Administered 2020-09-14 – 2020-09-15 (×3): 100 mg via ORAL
  Filled 2020-09-14 (×3): qty 1

## 2020-09-14 MED ORDER — CEFAZOLIN SODIUM-DEXTROSE 2-3 GM-%(50ML) IV SOLR
INTRAVENOUS | Status: DC | PRN
  Administered 2020-09-14: 2 g via INTRAVENOUS

## 2020-09-14 MED ORDER — MIDAZOLAM HCL 2 MG/2ML IJ SOLN
INTRAMUSCULAR | Status: DC | PRN
  Administered 2020-09-14: 2 mg via INTRAVENOUS

## 2020-09-14 MED ORDER — DOCUSATE SODIUM 100 MG PO CAPS
100.0000 mg | ORAL_CAPSULE | Freq: Two times a day (BID) | ORAL | Status: DC
  Administered 2020-09-14 – 2020-09-15 (×3): 100 mg via ORAL
  Filled 2020-09-14 (×3): qty 1

## 2020-09-14 MED ORDER — ROCURONIUM BROMIDE 10 MG/ML (PF) SYRINGE
PREFILLED_SYRINGE | INTRAVENOUS | Status: AC
  Filled 2020-09-14: qty 10

## 2020-09-14 MED ORDER — SODIUM CHLORIDE 0.9 % IV SOLN: INTRAVENOUS | Status: DC

## 2020-09-14 MED ORDER — BUPIVACAINE-EPINEPHRINE 0.5% -1:200000 IJ SOLN
INTRAMUSCULAR | Status: DC | PRN
  Administered 2020-09-14: 15 mL

## 2020-09-14 MED ORDER — MIDAZOLAM HCL 2 MG/2ML IJ SOLN
INTRAMUSCULAR | Status: AC
  Filled 2020-09-14: qty 2

## 2020-09-14 MED ORDER — OXYCODONE HCL 5 MG PO TABS
10.0000 mg | ORAL_TABLET | ORAL | Status: DC | PRN
  Administered 2020-09-14: 15 mg via ORAL
  Administered 2020-09-15: 10 mg via ORAL
  Filled 2020-09-14: qty 3
  Filled 2020-09-14: qty 2

## 2020-09-14 MED ORDER — SUCCINYLCHOLINE CHLORIDE 200 MG/10ML IV SOSY
PREFILLED_SYRINGE | INTRAVENOUS | Status: DC | PRN
  Administered 2020-09-14: 140 mg via INTRAVENOUS

## 2020-09-14 MED ORDER — PROPOFOL 10 MG/ML IV BOLUS
INTRAVENOUS | Status: DC | PRN
  Administered 2020-09-14: 30 mg via INTRAVENOUS
  Administered 2020-09-14: 160 mg via INTRAVENOUS
  Administered 2020-09-14: 40 mg via INTRAVENOUS

## 2020-09-14 MED ORDER — ONDANSETRON HCL 4 MG PO TABS: 4.0000 mg | ORAL_TABLET | Freq: Four times a day (QID) | ORAL | Status: DC | PRN

## 2020-09-14 MED ORDER — TETANUS-DIPHTH-ACELL PERTUSSIS 5-2.5-18.5 LF-MCG/0.5 IM SUSY
0.5000 mL | PREFILLED_SYRINGE | Freq: Once | INTRAMUSCULAR | Status: AC
  Administered 2020-09-14: 0.5 mL via INTRAMUSCULAR
  Filled 2020-09-14: qty 0.5

## 2020-09-14 MED ORDER — HALOPERIDOL LACTATE 5 MG/ML IJ SOLN
2.0000 mg | Freq: Once | INTRAMUSCULAR | Status: AC
  Administered 2020-09-14: 2 mg via INTRAVENOUS
  Filled 2020-09-14: qty 1

## 2020-09-14 MED ORDER — CEFAZOLIN SODIUM-DEXTROSE 1-4 GM/50ML-% IV SOLN
1.0000 g | Freq: Four times a day (QID) | INTRAVENOUS | Status: AC
  Administered 2020-09-14 – 2020-09-15 (×3): 1 g via INTRAVENOUS
  Filled 2020-09-14 (×3): qty 50

## 2020-09-14 MED ORDER — LACTATED RINGERS IV SOLN: INTRAVENOUS | Status: DC | PRN

## 2020-09-14 MED ORDER — METHOCARBAMOL 500 MG PO TABS: 500.0000 mg | ORAL_TABLET | Freq: Four times a day (QID) | ORAL | Status: DC | PRN

## 2020-09-14 MED ORDER — FENTANYL CITRATE (PF) 250 MCG/5ML IJ SOLN
INTRAMUSCULAR | Status: AC
  Filled 2020-09-14: qty 5

## 2020-09-14 MED ORDER — HYDROMORPHONE HCL 1 MG/ML IJ SOLN: 0.5000 mg | INTRAMUSCULAR | Status: DC | PRN

## 2020-09-14 MED ORDER — ONDANSETRON HCL 4 MG/2ML IJ SOLN
INTRAMUSCULAR | Status: AC
  Filled 2020-09-14: qty 2

## 2020-09-14 MED ORDER — OXYCODONE HCL 5 MG PO TABS
5.0000 mg | ORAL_TABLET | ORAL | Status: DC | PRN
  Administered 2020-09-14 – 2020-09-15 (×3): 10 mg via ORAL
  Filled 2020-09-14 (×3): qty 2

## 2020-09-14 MED ORDER — METOCLOPRAMIDE HCL 5 MG PO TABS: 5.0000 mg | ORAL_TABLET | Freq: Three times a day (TID) | ORAL | Status: DC | PRN

## 2020-09-14 MED ORDER — SUCCINYLCHOLINE CHLORIDE 200 MG/10ML IV SOSY
PREFILLED_SYRINGE | INTRAVENOUS | Status: AC
  Filled 2020-09-14: qty 10

## 2020-09-14 MED ORDER — 0.9 % SODIUM CHLORIDE (POUR BTL) OPTIME
TOPICAL | Status: DC | PRN
  Administered 2020-09-14: 1000 mL

## 2020-09-14 MED ORDER — LIDOCAINE 2% (20 MG/ML) 5 ML SYRINGE
INTRAMUSCULAR | Status: DC | PRN
  Administered 2020-09-14: 80 mg via INTRAVENOUS

## 2020-09-14 MED ORDER — METOCLOPRAMIDE HCL 5 MG/ML IJ SOLN: 5.0000 mg | Freq: Three times a day (TID) | INTRAMUSCULAR | Status: DC | PRN

## 2020-09-14 MED ORDER — LIDOCAINE 2% (20 MG/ML) 5 ML SYRINGE
INTRAMUSCULAR | Status: AC
  Filled 2020-09-14: qty 5

## 2020-09-14 MED ORDER — FENTANYL CITRATE (PF) 250 MCG/5ML IJ SOLN
INTRAMUSCULAR | Status: DC | PRN
  Administered 2020-09-14: 100 ug via INTRAVENOUS
  Administered 2020-09-14: 50 ug via INTRAVENOUS

## 2020-09-14 MED ORDER — DEXAMETHASONE SODIUM PHOSPHATE 10 MG/ML IJ SOLN
INTRAMUSCULAR | Status: DC | PRN
  Administered 2020-09-14: 10 mg via INTRAVENOUS

## 2020-09-14 MED ORDER — CLINDAMYCIN PHOSPHATE 600 MG/50ML IV SOLN
600.0000 mg | Freq: Once | INTRAVENOUS | Status: AC
  Administered 2020-09-14: 600 mg via INTRAVENOUS
  Filled 2020-09-14: qty 50

## 2020-09-14 MED ORDER — ONDANSETRON HCL 4 MG/2ML IJ SOLN: 4.0000 mg | Freq: Four times a day (QID) | INTRAMUSCULAR | Status: DC | PRN

## 2020-09-14 MED ORDER — PHENYLEPHRINE 40 MCG/ML (10ML) SYRINGE FOR IV PUSH (FOR BLOOD PRESSURE SUPPORT)
PREFILLED_SYRINGE | INTRAVENOUS | Status: AC
  Filled 2020-09-14: qty 10

## 2020-09-14 MED ORDER — ONDANSETRON HCL 4 MG/2ML IJ SOLN
INTRAMUSCULAR | Status: DC | PRN
  Administered 2020-09-14: 4 mg via INTRAVENOUS

## 2020-09-14 MED ORDER — LACTATED RINGERS IV SOLN
INTRAVENOUS | Status: AC | PRN
  Administered 2020-09-14: 999 mL via INTRAVENOUS

## 2020-09-14 SURGICAL SUPPLY — 71 items
BIT DRILL CALIBRATED 4.3MMX365 (DRILL) IMPLANT
BIT DRILL CROWE PNT TWST 4.5MM (DRILL) IMPLANT
BLADE SURG 15 STRL LF DISP TIS (BLADE) ×1 IMPLANT
BLADE SURG 15 STRL SS (BLADE) ×2
BNDG CMPR MED 10X6 ELC LF (GAUZE/BANDAGES/DRESSINGS) ×1
BNDG COHESIVE 6X5 TAN STRL LF (GAUZE/BANDAGES/DRESSINGS) ×2 IMPLANT
BNDG ELASTIC 4X5.8 VLCR STR LF (GAUZE/BANDAGES/DRESSINGS) ×2 IMPLANT
BNDG ELASTIC 6X10 VLCR STRL LF (GAUZE/BANDAGES/DRESSINGS) ×1 IMPLANT
BNDG ELASTIC 6X5.8 VLCR STR LF (GAUZE/BANDAGES/DRESSINGS) ×2 IMPLANT
BNDG GAUZE ELAST 4 BULKY (GAUZE/BANDAGES/DRESSINGS) ×2 IMPLANT
COVER WAND RF STERILE (DRAPES) ×2 IMPLANT
DRAPE C-ARM 42X72 X-RAY (DRAPES) ×2 IMPLANT
DRAPE C-ARMOR (DRAPES) ×1 IMPLANT
DRAPE HALF SHEET 40X57 (DRAPES) ×4 IMPLANT
DRAPE IMP U-DRAPE 54X76 (DRAPES) ×2 IMPLANT
DRAPE INCISE IOBAN 66X45 STRL (DRAPES) ×1 IMPLANT
DRAPE ORTHO SPLIT 77X108 STRL (DRAPES) ×4
DRAPE SURG 17X23 STRL (DRAPES) ×2 IMPLANT
DRAPE SURG ORHT 6 SPLT 77X108 (DRAPES) ×2 IMPLANT
DRAPE U-SHAPE 47X51 STRL (DRAPES) ×2 IMPLANT
DRESSING OPSITE X SMALL 2X3 (GAUZE/BANDAGES/DRESSINGS) ×2 IMPLANT
DRILL CALIBRATED 4.3MMX365 (DRILL) ×2
DRILL CROWE POINT TWIST 4.5MM (DRILL) ×2
DRSG MEPILEX BORDER 4X4 (GAUZE/BANDAGES/DRESSINGS) ×2 IMPLANT
DURAPREP 26ML APPLICATOR (WOUND CARE) ×2 IMPLANT
ELECT REM PT RETURN 9FT ADLT (ELECTROSURGICAL) ×2
ELECTRODE REM PT RTRN 9FT ADLT (ELECTROSURGICAL) ×1 IMPLANT
GAUZE SPONGE 4X4 12PLY STRL (GAUZE/BANDAGES/DRESSINGS) ×4 IMPLANT
GAUZE XEROFORM 1X8 LF (GAUZE/BANDAGES/DRESSINGS) ×2 IMPLANT
GAUZE XEROFORM 5X9 LF (GAUZE/BANDAGES/DRESSINGS) ×1 IMPLANT
GLOVE BIO SURGEON STRL SZ8 (GLOVE) ×2 IMPLANT
GLOVE BIOGEL PI IND STRL 8 (GLOVE) ×1 IMPLANT
GLOVE BIOGEL PI INDICATOR 8 (GLOVE) ×1
GLOVE ORTHO TXT STRL SZ7.5 (GLOVE) ×2 IMPLANT
GOWN STRL REUS W/ TWL LRG LVL3 (GOWN DISPOSABLE) ×2 IMPLANT
GOWN STRL REUS W/ TWL XL LVL3 (GOWN DISPOSABLE) ×4 IMPLANT
GOWN STRL REUS W/TWL LRG LVL3 (GOWN DISPOSABLE) ×4
GOWN STRL REUS W/TWL XL LVL3 (GOWN DISPOSABLE) ×8
GUIDEPIN 3.2X17.5 THRD DISP (PIN) ×1 IMPLANT
GUIDEWIRE BEAD TIP (WIRE) ×1 IMPLANT
KIT BASIN OR (CUSTOM PROCEDURE TRAY) ×2 IMPLANT
KIT TURNOVER KIT B (KITS) ×2 IMPLANT
MANIFOLD NEPTUNE II (INSTRUMENTS) ×2 IMPLANT
NAIL FEM RETRO 12X380 (Nail) ×1 IMPLANT
NEEDLE 22X1 1/2 (OR ONLY) (NEEDLE) ×2 IMPLANT
NS IRRIG 1000ML POUR BTL (IV SOLUTION) ×2 IMPLANT
PACK GENERAL/GYN (CUSTOM PROCEDURE TRAY) ×2 IMPLANT
PACK UNIVERSAL I (CUSTOM PROCEDURE TRAY) ×2 IMPLANT
PAD ABD 8X10 STRL (GAUZE/BANDAGES/DRESSINGS) ×2 IMPLANT
PAD ARMBOARD 7.5X6 YLW CONV (MISCELLANEOUS) ×4 IMPLANT
PAD CAST 4YDX4 CTTN HI CHSV (CAST SUPPLIES) ×1 IMPLANT
PADDING CAST ABS 6INX4YD NS (CAST SUPPLIES) ×1
PADDING CAST ABS COTTON 6X4 NS (CAST SUPPLIES) IMPLANT
PADDING CAST COTTON 4X4 STRL (CAST SUPPLIES) ×2
SCREW CORT TI DBL LEAD 5X38 (Screw) ×1 IMPLANT
SCREW CORT TI DBL LEAD 5X44 (Screw) ×1 IMPLANT
SCREW CORT TI DBL LEAD 5X60 (Screw) ×2 IMPLANT
SCREW CORT TI DBL LEAD 5X85 (Screw) ×1 IMPLANT
STAPLER VISISTAT 35W (STAPLE) ×2 IMPLANT
STOCKINETTE IMPERVIOUS LG (DRAPES) ×2 IMPLANT
SUT ETHILON 2 0 FS 18 (SUTURE) ×1 IMPLANT
SUT VIC AB 0 CT1 27 (SUTURE) ×2
SUT VIC AB 0 CT1 27XBRD ANBCTR (SUTURE) ×1 IMPLANT
SUT VIC AB 2-0 CT1 27 (SUTURE) ×2
SUT VIC AB 2-0 CT1 TAPERPNT 27 (SUTURE) ×1 IMPLANT
SUT VIC AB 2-0 SH 27 (SUTURE) ×2
SUT VIC AB 2-0 SH 27XBRD (SUTURE) IMPLANT
SYR CONTROL 10ML LL (SYRINGE) ×2 IMPLANT
TOWEL GREEN STERILE (TOWEL DISPOSABLE) ×2 IMPLANT
TOWEL GREEN STERILE FF (TOWEL DISPOSABLE) ×2 IMPLANT
WATER STERILE IRR 1000ML POUR (IV SOLUTION) ×2 IMPLANT
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
## 2020-09-14 NOTE — Progress Notes (Signed)
Orthopedic Tech Progress Note Patient Details:  Frank [name] Aug 29, 1991 [id number]  Musculoskeletal Traction Type of Traction: Bucks Skin Traction Traction Location: lle Traction Weight: 10 lbs   Post Interventions Patient Tolerated: Well Instructions Provided: Care of device, Adjustment of device   Trinna Post 09/14/2020, 6:34 AM

## 2020-09-14 NOTE — Anesthesia Procedure Notes (Signed)
Procedure Name: Intubation Date/Time: 09/14/2020 7:43 AM Performed by: Bryson Corona, CRNA Pre-anesthesia Checklist: Patient identified, Emergency Drugs available, Suction available and Patient being monitored Patient Re-evaluated:Patient Re-evaluated prior to induction Oxygen Delivery Method: Circle System Utilized Preoxygenation: Pre-oxygenation with 100% oxygen Induction Type: IV induction, Rapid sequence and Cricoid Pressure applied Laryngoscope Size: Mac and 4 Grade View: Grade I Tube type: Oral Tube size: 7.0 mm Number of attempts: 1 Airway Equipment and Method: Stylet Placement Confirmation: ETT inserted through vocal cords under direct vision,  positive ETCO2 and breath sounds checked- equal and bilateral Secured at: 22 cm Tube secured with: Tape Dental Injury: Teeth and Oropharynx as per pre-operative assessment

## 2020-09-14 NOTE — Anesthesia Preprocedure Evaluation (Signed)
Anesthesia Evaluation  Patient identified by MRN, date of birth, ID band Patient awake    Reviewed: Allergy & Precautions, NPO status , Patient's Chart, lab work & pertinent test resultsPreop documentation limited or incomplete due to emergent nature of procedure.  Airway Mallampati: II  TM Distance: >3 FB     Dental   Pulmonary neg pulmonary ROS,    breath sounds clear to auscultation       Cardiovascular negative cardio ROS   Rhythm:Regular Rate:Normal     Neuro/Psych    GI/Hepatic negative GI ROS, Neg liver ROS,   Endo/Other  negative endocrine ROS  Renal/GU      Musculoskeletal   Abdominal   Peds  Hematology negative hematology ROS (+)   Anesthesia Other Findings   Reproductive/Obstetrics                             Anesthesia Physical Anesthesia Plan  ASA: I  Anesthesia Plan: General   Post-op Pain Management:    Induction: Intravenous  PONV Risk Score and Plan: 2 and Ondansetron, Dexamethasone and Midazolam  Airway Management Planned: Oral ETT  Additional Equipment:   Intra-op Plan:   Post-operative Plan: Extubation in OR  Informed Consent: I have reviewed the patients History and Physical, chart, labs and discussed the procedure including the risks, benefits and alternatives for the proposed anesthesia with the patient or authorized representative who has indicated his/her understanding and acceptance.     Dental advisory given  Plan Discussed with: CRNA and Anesthesiologist  Anesthesia Plan Comments:         Anesthesia Quick Evaluation

## 2020-09-14 NOTE — Anesthesia Postprocedure Evaluation (Signed)
Anesthesia Post Note  Patient: Erminio Nygard  Procedure(s) Performed: INTRAMEDULLARY (IM) RETROGRADE FEMORAL NAILING (Left )     Patient location during evaluation: PACU Anesthesia Type: General Level of consciousness: awake Pain management: pain level controlled Vital Signs Assessment: post-procedure vital signs reviewed and stable Respiratory status: spontaneous breathing Cardiovascular status: stable Postop Assessment: no apparent nausea or vomiting Anesthetic complications: no   No complications documented.  Last Vitals:  Vitals:   09/14/20 1200 09/14/20 1225  BP: 135/65 131/74  Pulse: 84 92  Resp: 19 16  Temp:  37.3 C  SpO2: 97%     Last Pain:  Vitals:   09/14/20 1225  TempSrc: Oral  PainSc:                  Ramir Malerba

## 2020-09-14 NOTE — Transfer of Care (Signed)
Immediate Anesthesia Transfer of Care Note  Patient: Frank [name]  Procedure(s) Performed: INTRAMEDULLARY (IM) RETROGRADE FEMORAL NAILING (Left )  Patient Location: PACU  Anesthesia Type:General  Level of Consciousness: drowsy  Airway & Oxygen Therapy: Patient Spontanous Breathing and Patient connected to face mask oxygen  Post-op Assessment: Report given to RN and Post -op Vital signs reviewed and stable  Post vital signs: Reviewed and stable  Last Vitals:  Vitals Value Taken Time  BP 134/74 09/14/20 0927  Temp    Pulse 78 09/14/20 0930  Resp 17 09/14/20 0930  SpO2 100 % 09/14/20 0930  Vitals shown include unvalidated device data.  Last Pain:  Vitals:   09/14/20 0736  TempSrc:   PainSc: 10-Worst pain ever      Patients Stated Pain Goal: 2 (09/14/20 0736)  Complications: No complications documented.
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
## 2020-09-15 ENCOUNTER — Other Ambulatory Visit (HOSPITAL_COMMUNITY): Payer: Self-pay | Admitting: Orthopaedic Surgery

## 2020-09-15 ENCOUNTER — Encounter (HOSPITAL_COMMUNITY): Payer: Self-pay | Admitting: Orthopaedic Surgery

## 2020-09-15 LAB — CBC
HCT: 31.4 % — ABNORMAL LOW (ref 39.0–52.0)
Hemoglobin: 10.3 g/dL — ABNORMAL LOW (ref 13.0–17.0)
MCH: 29.1 pg (ref 26.0–34.0)
MCHC: 32.8 g/dL (ref 30.0–36.0)
MCV: 88.7 fL (ref 80.0–100.0)
Platelets: 222 10*3/uL (ref 150–400)
RBC: 3.54 MIL/uL — ABNORMAL LOW (ref 4.22–5.81)
RDW: 13.6 % (ref 11.5–15.5)
WBC: 12 10*3/uL — ABNORMAL HIGH (ref 4.0–10.5)
nRBC: 0 % (ref 0.0–0.2)

## 2020-09-15 LAB — BASIC METABOLIC PANEL
Anion gap: 13 (ref 5–15)
BUN: 7 mg/dL (ref 6–20)
CO2: 25 mmol/L (ref 22–32)
Calcium: 8.6 mg/dL — ABNORMAL LOW (ref 8.9–10.3)
Chloride: 99 mmol/L (ref 98–111)
Creatinine, Ser: 1.11 mg/dL (ref 0.61–1.24)
GFR, Estimated: >60 mL/min (ref >60)
Glucose, Bld: 137 mg/dL — ABNORMAL HIGH (ref 70–99)
Potassium: 3.8 mmol/L (ref 3.5–5.1)
Sodium: 137 mmol/L (ref 135–145)

## 2020-09-15 MED ORDER — OXYCODONE HCL 5 MG PO TABS
5.0000 mg | ORAL_TABLET | ORAL | 0 refills | Status: DC | PRN
Start: 2020-09-15 — End: 2020-09-18

## 2020-09-15 MED FILL — oxyCODONE HCL 5 MG TABS: 5 | 4 days supply | Qty: 30 | Fill #0
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
## 2020-09-15 NOTE — Plan of Care (Signed)
  Problem: Clinical Measurements: Goal: Postoperative complications will be avoided or minimized Outcome: Progressing   Problem: Skin Integrity: Goal: Demonstration of wound healing without infection will improve Outcome: Progressing   Problem: Education: Goal: Knowledge of General Education information will improve Description: Including pain rating scale, medication(s)/side effects and non-pharmacologic comfort measures Outcome: Progressing   Problem: Health Behavior/Discharge Planning: Goal: Ability to manage health-related needs will improve Outcome: Progressing   Problem: Clinical Measurements: Goal: Ability to maintain clinical measurements within normal limits will improve Outcome: Progressing Goal: Will remain free from infection Outcome: Progressing Goal: Diagnostic test results will improve Outcome: Progressing Goal: Respiratory complications will improve Outcome: Progressing Goal: Cardiovascular complication will be avoided Outcome: Progressing   Problem: Activity: Goal: Risk for activity intolerance will decrease Outcome: Progressing   Problem: Nutrition: Goal: Adequate nutrition will be maintained Outcome: Progressing   Problem: Coping: Goal: Level of anxiety will decrease Outcome: Progressing   Problem: Elimination: Goal: Will not experience complications related to bowel motility Outcome: Progressing Goal: Will not experience complications related to urinary retention Outcome: Progressing   Problem: Pain Managment: Goal: General experience of comfort will improve Outcome: Progressing   Problem: Safety: Goal: Ability to remain free from injury will improve Outcome: Progressing   Problem: Skin Integrity: Goal: Risk for impaired skin integrity will decrease Outcome: Progressing   

## 2020-09-15 NOTE — Progress Notes (Signed)
Discharge instructions (including medications) discussed with and copy provided to patient. Patient acknowledged information and did not have any questions. IV removed. Wife has picked him up.
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
## 2020-09-15 NOTE — Progress Notes (Signed)
Patient ID: Frank [name], male   DOB: 1991-04-08, 29 y.o.   MRN: [id number] The patient has been up with his walker and with therapy.  He is feeling great.  He is confident and comfortable with being discharged home today.  His vital signs remained stable.  I did change all his dressings.  I am also comfortable with sending him home this afternoon.
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
## 2020-09-16 ENCOUNTER — Telehealth: Payer: Self-pay

## 2020-09-16 ENCOUNTER — Telehealth: Payer: Self-pay | Admitting: Orthopaedic Surgery

## 2020-09-16 ENCOUNTER — Encounter: Payer: Self-pay | Admitting: Infectious Diseases

## 2020-09-16 NOTE — Telephone Encounter (Signed)
LMOM of the below message  
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
## 2020-09-16 NOTE — Telephone Encounter (Signed)
Wife aware

## 2020-09-16 NOTE — Telephone Encounter (Signed)
Pts wife Chiona called stating the pt recently had surgery and she noticed his leg looks twisted and she would like to know what needs to be done to correct this  [phone]

## 2020-09-16 NOTE — Telephone Encounter (Signed)
He only needed them in the hospital.  He doesn't need them now.

## 2020-09-16 NOTE — Telephone Encounter (Signed)
There is really nothing to do given the rod and screws that are in his left femur.  He was just discharged yesterday.  He needs to be not putting any weight on that leg.  We need to see him in follow-up at least next week.

## 2020-09-18 ENCOUNTER — Telehealth: Payer: Self-pay | Admitting: Radiology

## 2020-09-18 ENCOUNTER — Other Ambulatory Visit: Payer: Self-pay | Admitting: Orthopaedic Surgery

## 2020-09-18 ENCOUNTER — Other Ambulatory Visit: Payer: Self-pay

## 2020-09-18 ENCOUNTER — Ambulatory Visit: Payer: Medicaid Other | Attending: Orthopaedic Surgery

## 2020-09-18 DIAGNOSIS — M79652 Pain in left thigh: Secondary | ICD-10-CM | POA: Insufficient documentation

## 2020-09-18 DIAGNOSIS — M6281 Muscle weakness (generalized): Secondary | ICD-10-CM | POA: Diagnosis present

## 2020-09-18 DIAGNOSIS — S71132D Puncture wound without foreign body, left thigh, subsequent encounter: Secondary | ICD-10-CM | POA: Diagnosis present

## 2020-09-18 DIAGNOSIS — R262 Difficulty in walking, not elsewhere classified: Secondary | ICD-10-CM | POA: Diagnosis present

## 2020-09-18 DIAGNOSIS — M25652 Stiffness of left hip, not elsewhere classified: Secondary | ICD-10-CM | POA: Insufficient documentation

## 2020-09-18 MED ORDER — OXYCODONE HCL 5 MG PO TABS: 5.0000 mg | ORAL_TABLET | ORAL | 0 refills | Status: DC | PRN

## 2020-09-18 NOTE — Telephone Encounter (Signed)
Patients wife called in on the triage line, states that he took his last pain pill this morning and they need to get a refill on the Oxycodone.  Please call to let them know that this has been done.

## 2020-09-18 NOTE — Patient Instructions (Signed)
AAROM Lt ankle 4 way x 10 repos 2-3x/day  AAROM LT knee x10 2-3x/day  Flex /ext AAROM LT hip flex/ext, abduct/assuct, IR /ER x 10 2-3x/day Instructed spouse to not be forceful with  movement and not stretch except DF and PF

## 2020-09-18 NOTE — Telephone Encounter (Signed)
I just sent some in now.
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
## 2020-09-19 ENCOUNTER — Telehealth: Payer: Self-pay

## 2020-09-19 NOTE — Telephone Encounter (Signed)
Called and left a VM advising patient of Dr. Eliberto Ivory message.
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
## 2020-09-19 NOTE — Telephone Encounter (Signed)
Patient's wife Chi called stating that patient's left leg is swollen, bruised and purple.  Would like to know if this is normal or does patient need to be seen today?  Cb# [phone].  Please advise.  Thank you.

## 2020-09-22 ENCOUNTER — Encounter: Payer: Self-pay | Admitting: Physician Assistant

## 2020-09-22 ENCOUNTER — Other Ambulatory Visit: Payer: Self-pay

## 2020-09-22 ENCOUNTER — Ambulatory Visit: Payer: Self-pay

## 2020-09-22 ENCOUNTER — Ambulatory Visit (INDEPENDENT_AMBULATORY_CARE_PROVIDER_SITE_OTHER): Payer: Self-pay | Admitting: Physician Assistant

## 2020-09-22 DIAGNOSIS — M898X5 Other specified disorders of bone, thigh: Secondary | ICD-10-CM

## 2020-09-22 MED ORDER — OXYCODONE HCL 5 MG PO TABS: 5.0000 mg | ORAL_TABLET | ORAL | 0 refills | Status: DC | PRN
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
## 2020-09-29 ENCOUNTER — Encounter: Payer: Self-pay | Admitting: Physical Therapy

## 2020-09-29 ENCOUNTER — Encounter: Payer: Self-pay | Admitting: Orthopaedic Surgery

## 2020-09-29 ENCOUNTER — Ambulatory Visit: Payer: Medicaid Other | Admitting: Physical Therapy

## 2020-09-29 ENCOUNTER — Other Ambulatory Visit: Payer: Self-pay

## 2020-09-29 ENCOUNTER — Ambulatory Visit (INDEPENDENT_AMBULATORY_CARE_PROVIDER_SITE_OTHER): Payer: Self-pay | Admitting: Orthopaedic Surgery

## 2020-09-29 DIAGNOSIS — M6281 Muscle weakness (generalized): Secondary | ICD-10-CM

## 2020-09-29 DIAGNOSIS — R262 Difficulty in walking, not elsewhere classified: Secondary | ICD-10-CM

## 2020-09-29 DIAGNOSIS — S72352D Displaced comminuted fracture of shaft of left femur, subsequent encounter for closed fracture with routine healing: Secondary | ICD-10-CM

## 2020-09-29 DIAGNOSIS — M25652 Stiffness of left hip, not elsewhere classified: Secondary | ICD-10-CM

## 2020-09-29 DIAGNOSIS — S71132D Puncture wound without foreign body, left thigh, subsequent encounter: Secondary | ICD-10-CM

## 2020-09-29 DIAGNOSIS — M79652 Pain in left thigh: Secondary | ICD-10-CM | POA: Diagnosis not present

## 2020-09-29 MED ORDER — OXYCODONE HCL 5 MG PO TABS: 5.0000 mg | ORAL_TABLET | Freq: Four times a day (QID) | ORAL | 0 refills | Status: DC | PRN
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
## 2020-10-01 ENCOUNTER — Encounter: Payer: Self-pay | Admitting: Physical Therapy

## 2020-10-01 ENCOUNTER — Ambulatory Visit: Payer: Medicaid Other | Attending: Orthopaedic Surgery | Admitting: Physical Therapy

## 2020-10-01 ENCOUNTER — Other Ambulatory Visit: Payer: Self-pay

## 2020-10-01 DIAGNOSIS — M6281 Muscle weakness (generalized): Secondary | ICD-10-CM | POA: Insufficient documentation

## 2020-10-01 DIAGNOSIS — M25652 Stiffness of left hip, not elsewhere classified: Secondary | ICD-10-CM | POA: Diagnosis present

## 2020-10-01 DIAGNOSIS — M79652 Pain in left thigh: Secondary | ICD-10-CM | POA: Diagnosis not present

## 2020-10-01 DIAGNOSIS — R262 Difficulty in walking, not elsewhere classified: Secondary | ICD-10-CM | POA: Insufficient documentation

## 2020-10-01 DIAGNOSIS — S71132D Puncture wound without foreign body, left thigh, subsequent encounter: Secondary | ICD-10-CM | POA: Insufficient documentation
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
## 2020-10-08 ENCOUNTER — Other Ambulatory Visit: Payer: Self-pay

## 2020-10-08 ENCOUNTER — Ambulatory Visit: Payer: Medicaid Other | Admitting: Physical Therapy

## 2020-10-08 DIAGNOSIS — M6281 Muscle weakness (generalized): Secondary | ICD-10-CM

## 2020-10-08 DIAGNOSIS — M25652 Stiffness of left hip, not elsewhere classified: Secondary | ICD-10-CM

## 2020-10-08 DIAGNOSIS — S71132D Puncture wound without foreign body, left thigh, subsequent encounter: Secondary | ICD-10-CM

## 2020-10-08 DIAGNOSIS — R262 Difficulty in walking, not elsewhere classified: Secondary | ICD-10-CM

## 2020-10-08 DIAGNOSIS — M79652 Pain in left thigh: Secondary | ICD-10-CM | POA: Diagnosis not present
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
## 2020-10-10 ENCOUNTER — Encounter: Payer: Self-pay | Admitting: Physical Therapy

## 2020-10-10 ENCOUNTER — Other Ambulatory Visit: Payer: Self-pay

## 2020-10-10 ENCOUNTER — Ambulatory Visit: Payer: Medicaid Other | Admitting: Physical Therapy

## 2020-10-10 DIAGNOSIS — M6281 Muscle weakness (generalized): Secondary | ICD-10-CM | POA: Diagnosis present

## 2020-10-10 DIAGNOSIS — M25652 Stiffness of left hip, not elsewhere classified: Secondary | ICD-10-CM | POA: Diagnosis present

## 2020-10-10 DIAGNOSIS — R262 Difficulty in walking, not elsewhere classified: Secondary | ICD-10-CM

## 2020-10-10 DIAGNOSIS — M79652 Pain in left thigh: Secondary | ICD-10-CM | POA: Diagnosis not present

## 2020-10-10 DIAGNOSIS — S71132D Puncture wound without foreign body, left thigh, subsequent encounter: Secondary | ICD-10-CM
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
## 2020-10-14 ENCOUNTER — Other Ambulatory Visit: Payer: Self-pay

## 2020-10-14 ENCOUNTER — Ambulatory Visit: Payer: Medicaid Other

## 2020-10-14 DIAGNOSIS — M79652 Pain in left thigh: Secondary | ICD-10-CM | POA: Diagnosis not present

## 2020-10-14 DIAGNOSIS — M25652 Stiffness of left hip, not elsewhere classified: Secondary | ICD-10-CM

## 2020-10-14 DIAGNOSIS — M6281 Muscle weakness (generalized): Secondary | ICD-10-CM

## 2020-10-14 DIAGNOSIS — R262 Difficulty in walking, not elsewhere classified: Secondary | ICD-10-CM

## 2020-10-14 DIAGNOSIS — S71132D Puncture wound without foreign body, left thigh, subsequent encounter: Secondary | ICD-10-CM
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
## 2020-10-16 ENCOUNTER — Ambulatory Visit: Payer: Medicaid Other

## 2020-10-16 ENCOUNTER — Other Ambulatory Visit: Payer: Self-pay

## 2020-10-16 DIAGNOSIS — S71132D Puncture wound without foreign body, left thigh, subsequent encounter: Secondary | ICD-10-CM

## 2020-10-16 DIAGNOSIS — M79652 Pain in left thigh: Secondary | ICD-10-CM

## 2020-10-16 DIAGNOSIS — R262 Difficulty in walking, not elsewhere classified: Secondary | ICD-10-CM

## 2020-10-16 DIAGNOSIS — M6281 Muscle weakness (generalized): Secondary | ICD-10-CM

## 2020-10-16 DIAGNOSIS — M25652 Stiffness of left hip, not elsewhere classified: Secondary | ICD-10-CM
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
## 2020-10-21 ENCOUNTER — Other Ambulatory Visit: Payer: Self-pay

## 2020-10-21 ENCOUNTER — Ambulatory Visit: Payer: Medicaid Other

## 2020-10-21 DIAGNOSIS — S71132D Puncture wound without foreign body, left thigh, subsequent encounter: Secondary | ICD-10-CM

## 2020-10-21 DIAGNOSIS — M25652 Stiffness of left hip, not elsewhere classified: Secondary | ICD-10-CM

## 2020-10-21 DIAGNOSIS — M6281 Muscle weakness (generalized): Secondary | ICD-10-CM

## 2020-10-21 DIAGNOSIS — M79652 Pain in left thigh: Secondary | ICD-10-CM | POA: Diagnosis not present

## 2020-10-21 DIAGNOSIS — R262 Difficulty in walking, not elsewhere classified: Secondary | ICD-10-CM
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
## 2020-10-23 ENCOUNTER — Ambulatory Visit: Payer: Medicaid Other

## 2020-10-23 ENCOUNTER — Other Ambulatory Visit: Payer: Self-pay

## 2020-10-23 DIAGNOSIS — M6281 Muscle weakness (generalized): Secondary | ICD-10-CM

## 2020-10-23 DIAGNOSIS — S71132D Puncture wound without foreign body, left thigh, subsequent encounter: Secondary | ICD-10-CM

## 2020-10-23 DIAGNOSIS — M79652 Pain in left thigh: Secondary | ICD-10-CM | POA: Diagnosis not present

## 2020-10-23 DIAGNOSIS — M25652 Stiffness of left hip, not elsewhere classified: Secondary | ICD-10-CM

## 2020-10-23 DIAGNOSIS — R262 Difficulty in walking, not elsewhere classified: Secondary | ICD-10-CM
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
## 2020-10-27 ENCOUNTER — Other Ambulatory Visit: Payer: Self-pay

## 2020-10-27 ENCOUNTER — Encounter: Payer: Self-pay | Admitting: Orthopaedic Surgery

## 2020-10-27 ENCOUNTER — Ambulatory Visit (INDEPENDENT_AMBULATORY_CARE_PROVIDER_SITE_OTHER): Payer: Self-pay | Admitting: Orthopaedic Surgery

## 2020-10-27 ENCOUNTER — Ambulatory Visit (INDEPENDENT_AMBULATORY_CARE_PROVIDER_SITE_OTHER): Payer: Self-pay

## 2020-10-27 DIAGNOSIS — S72352D Displaced comminuted fracture of shaft of left femur, subsequent encounter for closed fracture with routine healing: Secondary | ICD-10-CM
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
## 2020-10-30 ENCOUNTER — Ambulatory Visit: Payer: Medicaid Other | Admitting: Physical Therapy

## 2020-11-27 ENCOUNTER — Ambulatory Visit (INDEPENDENT_AMBULATORY_CARE_PROVIDER_SITE_OTHER): Payer: Medicaid Other | Admitting: Orthopaedic Surgery

## 2020-11-27 ENCOUNTER — Ambulatory Visit (INDEPENDENT_AMBULATORY_CARE_PROVIDER_SITE_OTHER): Payer: Medicaid Other

## 2020-11-27 ENCOUNTER — Encounter: Payer: Self-pay | Admitting: Orthopaedic Surgery

## 2020-11-27 DIAGNOSIS — S72352D Displaced comminuted fracture of shaft of left femur, subsequent encounter for closed fracture with routine healing: Secondary | ICD-10-CM | POA: Diagnosis not present
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
## 2020-12-13 ENCOUNTER — Encounter (HOSPITAL_COMMUNITY): Payer: Self-pay

## 2020-12-13 ENCOUNTER — Ambulatory Visit (INDEPENDENT_AMBULATORY_CARE_PROVIDER_SITE_OTHER): Payer: Medicaid Other

## 2020-12-13 ENCOUNTER — Other Ambulatory Visit: Payer: Self-pay

## 2020-12-13 ENCOUNTER — Ambulatory Visit (HOSPITAL_COMMUNITY)
Admission: EM | Admit: 2020-12-13 | Discharge: 2020-12-13 | Disposition: A | Discharge status: Home / Self Care | Payer: Medicaid Other | Attending: Medical Oncology | Admitting: Medical Oncology

## 2020-12-13 DIAGNOSIS — M25562 Pain in left knee: Secondary | ICD-10-CM

## 2020-12-13 DIAGNOSIS — M79605 Pain in left leg: Secondary | ICD-10-CM | POA: Diagnosis not present

## 2020-12-13 MED ORDER — IBUPROFEN 800 MG PO TABS: 800.0000 mg | ORAL_TABLET | Freq: Three times a day (TID) | ORAL | 0 refills | Status: AC

## 2020-12-13 NOTE — ED Triage Notes (Signed)
Pt presents with sharp pain on his left leg. He states he has screws in his leg and is concerned something is wrong.
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
## 2021-01-08 ENCOUNTER — Ambulatory Visit (INDEPENDENT_AMBULATORY_CARE_PROVIDER_SITE_OTHER): Payer: Medicaid Other

## 2021-01-08 ENCOUNTER — Ambulatory Visit (INDEPENDENT_AMBULATORY_CARE_PROVIDER_SITE_OTHER): Payer: Medicaid Other | Admitting: Orthopaedic Surgery

## 2021-01-08 ENCOUNTER — Encounter: Payer: Self-pay | Admitting: Orthopaedic Surgery

## 2021-01-08 DIAGNOSIS — S72352D Displaced comminuted fracture of shaft of left femur, subsequent encounter for closed fracture with routine healing: Secondary | ICD-10-CM
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
## 2021-04-09 ENCOUNTER — Ambulatory Visit: Payer: Medicaid Other | Admitting: Orthopaedic Surgery

## 2021-04-15 ENCOUNTER — Ambulatory Visit: Payer: Medicaid Other | Admitting: Orthopaedic Surgery

## 2021-04-21 ENCOUNTER — Encounter: Payer: Self-pay | Admitting: Orthopaedic Surgery

## 2021-04-21 ENCOUNTER — Ambulatory Visit (INDEPENDENT_AMBULATORY_CARE_PROVIDER_SITE_OTHER): Payer: Medicaid Other | Admitting: Orthopaedic Surgery

## 2021-04-21 ENCOUNTER — Ambulatory Visit (INDEPENDENT_AMBULATORY_CARE_PROVIDER_SITE_OTHER): Payer: Medicaid Other

## 2021-04-21 DIAGNOSIS — S72352D Displaced comminuted fracture of shaft of left femur, subsequent encounter for closed fracture with routine healing: Secondary | ICD-10-CM
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
## 2021-07-22 ENCOUNTER — Encounter: Payer: Self-pay | Admitting: Orthopaedic Surgery

## 2021-07-22 ENCOUNTER — Ambulatory Visit (INDEPENDENT_AMBULATORY_CARE_PROVIDER_SITE_OTHER): Payer: Medicaid Other | Admitting: Orthopaedic Surgery

## 2021-07-22 ENCOUNTER — Ambulatory Visit: Payer: Self-pay

## 2021-07-22 DIAGNOSIS — S72352D Displaced comminuted fracture of shaft of left femur, subsequent encounter for closed fracture with routine healing: Secondary | ICD-10-CM | POA: Diagnosis not present
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
## 2022-02-22 ENCOUNTER — Ambulatory Visit: Payer: Medicaid Other | Admitting: Physician Assistant

## 2022-03-24 ENCOUNTER — Encounter: Payer: Self-pay | Admitting: Infectious Diseases
